# Patient Record
Sex: Female | Born: 1950 | Race: Black or African American | Hispanic: No | Smoking: Never smoker
Health system: Southern US, Community
[De-identification: ages and names within clinical notes are randomized; demographics above are authoritative.]

## PROBLEM LIST (undated history)

## (undated) DIAGNOSIS — I10 Essential (primary) hypertension: Secondary | ICD-10-CM

## (undated) DIAGNOSIS — M549 Dorsalgia, unspecified: Secondary | ICD-10-CM

## (undated) DIAGNOSIS — C07 Malignant neoplasm of parotid gland: Secondary | ICD-10-CM

## (undated) DIAGNOSIS — M1990 Unspecified osteoarthritis, unspecified site: Secondary | ICD-10-CM

## (undated) DIAGNOSIS — G8929 Other chronic pain: Secondary | ICD-10-CM

## (undated) HISTORY — PX: SALIVARY GLAND SURGERY: SHX768

---

## 2008-07-31 ENCOUNTER — Emergency Department: Payer: Self-pay | Admitting: Internal Medicine

## 2009-07-18 ENCOUNTER — Ambulatory Visit: Payer: Self-pay

## 2010-09-12 ENCOUNTER — Ambulatory Visit: Payer: Self-pay | Admitting: Family Medicine

## 2011-09-06 ENCOUNTER — Emergency Department: Payer: Self-pay | Admitting: *Deleted

## 2011-10-14 ENCOUNTER — Emergency Department: Payer: Self-pay | Admitting: Internal Medicine

## 2012-02-04 ENCOUNTER — Emergency Department: Payer: Self-pay | Admitting: Emergency Medicine

## 2012-04-04 ENCOUNTER — Emergency Department: Payer: Self-pay | Admitting: Emergency Medicine

## 2012-08-30 ENCOUNTER — Ambulatory Visit: Payer: Self-pay | Admitting: Family Medicine

## 2013-04-24 ENCOUNTER — Emergency Department: Payer: Self-pay | Admitting: Emergency Medicine

## 2013-08-04 ENCOUNTER — Ambulatory Visit: Payer: Self-pay | Admitting: Otolaryngology

## 2013-11-17 ENCOUNTER — Emergency Department: Payer: Self-pay | Admitting: Internal Medicine

## 2013-11-17 LAB — CBC
HCT: 38 % (ref 35.0–47.0)
HGB: 12.5 g/dL (ref 12.0–16.0)
MCH: 27.7 pg (ref 26.0–34.0)
MCHC: 32.8 g/dL (ref 32.0–36.0)
MCV: 84 fL (ref 80–100)
Platelet: 201 10*3/uL (ref 150–440)
RBC: 4.5 10*6/uL (ref 3.80–5.20)
RDW: 14.8 % — ABNORMAL HIGH (ref 11.5–14.5)
WBC: 7.8 10*3/uL (ref 3.6–11.0)

## 2013-11-17 LAB — BASIC METABOLIC PANEL
ANION GAP: 13 (ref 7–16)
BUN: 9 mg/dL (ref 7–18)
CO2: 24 mmol/L (ref 21–32)
CREATININE: 0.85 mg/dL (ref 0.60–1.30)
Calcium, Total: 9.2 mg/dL (ref 8.5–10.1)
Chloride: 98 mmol/L (ref 98–107)
EGFR (African American): >60
GLUCOSE: 140 mg/dL — AB (ref 65–99)
Osmolality: 271 (ref 275–301)
POTASSIUM: 2.9 mmol/L — AB (ref 3.5–5.1)
SODIUM: 135 mmol/L — AB (ref 136–145)

## 2013-11-17 LAB — D-DIMER(ARMC): D-DIMER: 725 ng/mL

## 2013-11-17 LAB — TROPONIN I: Troponin-I: <0.02 ng/mL

## 2014-01-08 ENCOUNTER — Emergency Department: Payer: Self-pay | Admitting: Emergency Medicine

## 2014-01-08 LAB — URINALYSIS, COMPLETE
Bacteria: NONE SEEN
Bilirubin,UR: NEGATIVE
Blood: NEGATIVE
Glucose,UR: NEGATIVE mg/dL (ref 0–75)
Hyaline Cast: 3
Ketone: NEGATIVE
Nitrite: NEGATIVE
Ph: 6 (ref 4.5–8.0)
Protein: NEGATIVE
RBC,UR: 1 /HPF (ref 0–5)
Specific Gravity: 1.014 (ref 1.003–1.030)
Squamous Epithelial: 6
WBC UR: 14 /HPF (ref 0–5)

## 2014-01-08 LAB — CBC
HCT: 33.2 % — AB (ref 35.0–47.0)
HGB: 11.3 g/dL — ABNORMAL LOW (ref 12.0–16.0)
MCH: 29.5 pg (ref 26.0–34.0)
MCHC: 34 g/dL (ref 32.0–36.0)
MCV: 87 fL (ref 80–100)
PLATELETS: 192 10*3/uL (ref 150–440)
RBC: 3.82 10*6/uL (ref 3.80–5.20)
RDW: 20.4 % — ABNORMAL HIGH (ref 11.5–14.5)
WBC: 6 10*3/uL (ref 3.6–11.0)

## 2014-01-08 LAB — COMPREHENSIVE METABOLIC PANEL
Albumin: 3.4 g/dL (ref 3.4–5.0)
Alkaline Phosphatase: 76 U/L
Anion Gap: 9 (ref 7–16)
BUN: 10 mg/dL (ref 7–18)
Bilirubin,Total: 0.5 mg/dL (ref 0.2–1.0)
Calcium, Total: 9.5 mg/dL (ref 8.5–10.1)
Chloride: 100 mmol/L (ref 98–107)
Co2: 26 mmol/L (ref 21–32)
Creatinine: 0.84 mg/dL (ref 0.60–1.30)
EGFR (African American): >60
EGFR (Non-African Amer.): >60
Glucose: 144 mg/dL — ABNORMAL HIGH (ref 65–99)
Osmolality: 272 (ref 275–301)
Potassium: 3.5 mmol/L (ref 3.5–5.1)
SGOT(AST): 10 U/L — ABNORMAL LOW (ref 15–37)
SGPT (ALT): 25 U/L
Sodium: 135 mmol/L — ABNORMAL LOW (ref 136–145)
Total Protein: 7.3 g/dL (ref 6.4–8.2)

## 2014-01-08 LAB — CK TOTAL AND CKMB (NOT AT ARMC): CK, TOTAL: 44 U/L

## 2014-01-08 LAB — TROPONIN I
Troponin-I: <0.02 ng/mL
Troponin-I: <0.02 ng/mL

## 2014-01-08 LAB — D-DIMER(ARMC): D-Dimer: 1106 ng/ml

## 2014-01-08 LAB — LIPASE, BLOOD: Lipase: 129 U/L (ref 73–393)

## 2014-03-25 ENCOUNTER — Ambulatory Visit: Payer: Self-pay | Admitting: Physical Medicine and Rehabilitation

## 2014-04-02 ENCOUNTER — Emergency Department: Payer: Self-pay | Admitting: Emergency Medicine

## 2014-04-02 LAB — COMPREHENSIVE METABOLIC PANEL WITH GFR
Albumin: 3.9 g/dL
Alkaline Phosphatase: 80 U/L
Anion Gap: 11
BUN: 13 mg/dL
Bilirubin,Total: 0.5 mg/dL
Calcium, Total: 9.7 mg/dL
Chloride: 103 mmol/L
Co2: 25 mmol/L
Creatinine: 1.06 mg/dL
EGFR (African American): >60
EGFR (Non-African Amer.): 56 — ABNORMAL LOW
Glucose: 100 mg/dL — ABNORMAL HIGH
Osmolality: 278
Potassium: 3.9 mmol/L
SGOT(AST): 19 U/L
SGPT (ALT): 32 U/L
Sodium: 139 mmol/L
Total Protein: 7.7 g/dL

## 2014-04-02 LAB — CBC
HCT: 37.2 %
HGB: 12.7 g/dL
MCH: 30.9 pg
MCHC: 34.1 g/dL
MCV: 91 fL
Platelet: 189 x10 3/mm 3
RBC: 4.1 X10 6/mm 3
RDW: 12.5 %
WBC: 9.5 x10 3/mm 3

## 2014-04-02 LAB — TROPONIN I: Troponin-I: <0.02 ng/mL

## 2014-04-07 ENCOUNTER — Emergency Department: Payer: Self-pay | Admitting: Emergency Medicine

## 2014-04-08 ENCOUNTER — Observation Stay: Payer: Self-pay | Admitting: Internal Medicine

## 2014-04-08 LAB — BASIC METABOLIC PANEL
Anion Gap: 9 (ref 7–16)
BUN: 13 mg/dL (ref 7–18)
Calcium, Total: 9.4 mg/dL (ref 8.5–10.1)
Chloride: 107 mmol/L (ref 98–107)
Co2: 27 mmol/L (ref 21–32)
Creatinine: 0.95 mg/dL (ref 0.60–1.30)
EGFR (African American): >60
EGFR (Non-African Amer.): >60
Glucose: 110 mg/dL — ABNORMAL HIGH (ref 65–99)
OSMOLALITY: 286 (ref 275–301)
POTASSIUM: 3.4 mmol/L — AB (ref 3.5–5.1)
Sodium: 143 mmol/L (ref 136–145)

## 2014-04-08 LAB — CBC WITH DIFFERENTIAL/PLATELET
Basophil #: 0 10*3/uL (ref 0.0–0.1)
Basophil %: 0.3 %
Eosinophil #: 0 10*3/uL (ref 0.0–0.7)
Eosinophil %: 0.2 %
HCT: 33.8 % — ABNORMAL LOW (ref 35.0–47.0)
HGB: 11.6 g/dL — ABNORMAL LOW (ref 12.0–16.0)
Lymphocyte #: 1 10*3/uL (ref 1.0–3.6)
Lymphocyte %: 13.1 %
MCH: 30.5 pg (ref 26.0–34.0)
MCHC: 34.3 g/dL (ref 32.0–36.0)
MCV: 89 fL (ref 80–100)
MONOS PCT: 11 %
Monocyte #: 0.9 x10 3/mm (ref 0.2–0.9)
NEUTROS ABS: 5.9 10*3/uL (ref 1.4–6.5)
NEUTROS PCT: 75.4 %
Platelet: 168 10*3/uL (ref 150–440)
RBC: 3.79 10*6/uL — ABNORMAL LOW (ref 3.80–5.20)
RDW: 13 % (ref 11.5–14.5)
WBC: 7.8 10*3/uL (ref 3.6–11.0)

## 2014-04-08 LAB — URINALYSIS, COMPLETE
BACTERIA: NONE SEEN
BILIRUBIN, UR: NEGATIVE
Blood: NEGATIVE
GLUCOSE, UR: NEGATIVE mg/dL (ref 0–75)
LEUKOCYTE ESTERASE: NEGATIVE
Nitrite: NEGATIVE
Ph: 5 (ref 4.5–8.0)
Protein: NEGATIVE
RBC,UR: <1 /HPF (ref 0–5)
Specific Gravity: 1.025 (ref 1.003–1.030)
Squamous Epithelial: NONE SEEN

## 2014-04-08 LAB — HEMOGLOBIN
HGB: 11.1 g/dL — ABNORMAL LOW (ref 12.0–16.0)
HGB: 10.5 g/dL — ABNORMAL LOW (ref 12.0–16.0)

## 2014-04-08 LAB — SEDIMENTATION RATE: Erythrocyte Sed Rate: 37 mm/hr — ABNORMAL HIGH (ref 0–30)

## 2014-04-08 LAB — TROPONIN I

## 2014-04-09 LAB — BASIC METABOLIC PANEL
Anion Gap: 6 — ABNORMAL LOW (ref 7–16)
BUN: 5 mg/dL — AB (ref 7–18)
CO2: 30 mmol/L (ref 21–32)
Calcium, Total: 8.3 mg/dL — ABNORMAL LOW (ref 8.5–10.1)
Chloride: 109 mmol/L — ABNORMAL HIGH (ref 98–107)
Creatinine: 0.74 mg/dL (ref 0.60–1.30)
GLUCOSE: 91 mg/dL (ref 65–99)
OSMOLALITY: 286 (ref 275–301)
POTASSIUM: 3.6 mmol/L (ref 3.5–5.1)
SODIUM: 145 mmol/L (ref 136–145)

## 2014-04-09 LAB — CBC WITH DIFFERENTIAL/PLATELET
BASOS ABS: 0 10*3/uL (ref 0.0–0.1)
Basophil %: 0.3 %
Eosinophil #: 0 10*3/uL (ref 0.0–0.7)
Eosinophil %: 0.4 %
HCT: 30.2 % — ABNORMAL LOW (ref 35.0–47.0)
HGB: 10.4 g/dL — ABNORMAL LOW (ref 12.0–16.0)
LYMPHS PCT: 17 %
Lymphocyte #: 0.9 10*3/uL — ABNORMAL LOW (ref 1.0–3.6)
MCH: 30.9 pg (ref 26.0–34.0)
MCHC: 34.5 g/dL (ref 32.0–36.0)
MCV: 90 fL (ref 80–100)
MONO ABS: 0.8 x10 3/mm (ref 0.2–0.9)
Monocyte %: 14.6 %
NEUTROS ABS: 3.5 10*3/uL (ref 1.4–6.5)
Neutrophil %: 67.7 %
Platelet: 154 10*3/uL (ref 150–440)
RBC: 3.36 10*6/uL — ABNORMAL LOW (ref 3.80–5.20)
RDW: 12.9 % (ref 11.5–14.5)
WBC: 5.2 10*3/uL (ref 3.6–11.0)

## 2014-04-10 LAB — BASIC METABOLIC PANEL
Anion Gap: 8 (ref 7–16)
BUN: 6 mg/dL — ABNORMAL LOW (ref 7–18)
CALCIUM: 8.6 mg/dL (ref 8.5–10.1)
CO2: 28 mmol/L (ref 21–32)
CREATININE: 0.76 mg/dL (ref 0.60–1.30)
Chloride: 109 mmol/L — ABNORMAL HIGH (ref 98–107)
EGFR (Non-African Amer.): >60
Glucose: 116 mg/dL — ABNORMAL HIGH (ref 65–99)
OSMOLALITY: 287 (ref 275–301)
Potassium: 3.5 mmol/L (ref 3.5–5.1)
Sodium: 145 mmol/L (ref 136–145)

## 2014-04-10 LAB — CBC WITH DIFFERENTIAL/PLATELET
Basophil #: 0 10*3/uL (ref 0.0–0.1)
Basophil %: 0.2 %
EOS PCT: 0 %
Eosinophil #: 0 10*3/uL (ref 0.0–0.7)
HCT: 30.1 % — AB (ref 35.0–47.0)
HGB: 10.6 g/dL — ABNORMAL LOW (ref 12.0–16.0)
Lymphocyte #: 1 10*3/uL (ref 1.0–3.6)
Lymphocyte %: 15 %
MCH: 31.3 pg (ref 26.0–34.0)
MCHC: 35.1 g/dL (ref 32.0–36.0)
MCV: 89 fL (ref 80–100)
MONO ABS: 0.7 x10 3/mm (ref 0.2–0.9)
Monocyte %: 11.4 %
Neutrophil #: 4.7 10*3/uL (ref 1.4–6.5)
Neutrophil %: 73.4 %
Platelet: 160 10*3/uL (ref 150–440)
RBC: 3.38 10*6/uL — AB (ref 3.80–5.20)
RDW: 12.6 % (ref 11.5–14.5)
WBC: 6.4 10*3/uL (ref 3.6–11.0)

## 2014-04-27 ENCOUNTER — Emergency Department: Payer: Self-pay | Admitting: Emergency Medicine

## 2014-04-28 LAB — URINALYSIS, COMPLETE
BLOOD: NEGATIVE
Bacteria: NONE SEEN
Bilirubin,UR: NEGATIVE
Glucose,UR: NEGATIVE mg/dL (ref 0–75)
Ketone: NEGATIVE
Nitrite: NEGATIVE
PROTEIN: NEGATIVE
Ph: 5 (ref 4.5–8.0)
RBC,UR: <1 /HPF (ref 0–5)
Specific Gravity: 1.019 (ref 1.003–1.030)
Squamous Epithelial: 2

## 2014-08-07 ENCOUNTER — Emergency Department: Payer: Self-pay | Admitting: Emergency Medicine

## 2014-08-13 ENCOUNTER — Emergency Department: Payer: Self-pay | Admitting: Emergency Medicine

## 2014-08-22 ENCOUNTER — Emergency Department: Payer: Self-pay | Admitting: Emergency Medicine

## 2014-09-16 NOTE — H&P (Signed)
PATIENT NAME:  Theresa Mercado, Theresa Mercado MR#:  [id number] DATE OF BIRTH:  08/08/1950  DATE OF ADMISSION:  04/08/2014  ADMITTING PHYSICIAN: Gladstone Lighter, MD  PRIMARY CARE PHYSICIAN: [facility]   CHIEF COMPLAINT: Low back pain.   HISTORY OF PRESENT ILLNESS: Ms. Forsman is a 64 year old obese African American female with a past medical history significant for hypertension, history of parotid gland cancer, status post surgery and chemoradiation, presents to the hospital secondary to acute on chronic low back. The patient has been having a low back pain for several years now, but got worse recently, for which she was referred to see [facility] physiotherapist Dr. Sharlet Salina. She had an outpatient MRI done on 03/25/2014, which showed degenerative disease for age, moderate left facet arthrosis, no stenosis. The patient just had 2 doses of steroids done at a couple of lower back spaces 2 days ago on 04/06/2014 by Dr. Sharlet Salina. In spite of injection, she went home. She was having worsening of her pain, especially on the right side, paraspinal area, radiating all the way to her right hip. No sensory changes beyond the knees in both legs. Her ability to stand and walk has been impaired secondary to worsening pain, and she presented to the ER yesterday morning. She was discharged on ibuprofen, Percocet, pain medication, and also  diazepam as needed for muscle spasms. However, pain got worse. She came back to the ER. All night long she received IV Dilaudid for pain, and is being admitted for pain management.   PAST MEDICAL HISTORY:  1.  Hypertension.  2.  Parotid cancer in remission.   PAST SURGICAL HISTORY: Left facial reconstruction after parotid gland tumor resection.   ALLERGIES TO MEDICATIONS: No known drug allergies.   CURRENT HOME MEDICATIONS:  1.  Lisinopril 5 mg p.o. daily.  2.  Tramadol 50 mg p.o. q. 6 hours p.r.n. for pain.   THE PATIENT WAS DISCHARGED ON THE FOLLOWING MEDICATIONS FROM THE  EMERGENCY ROOM:  1.  Motrin 800 mg 1 capsule q. 8 hours p.r.n. for pain.  2.  Diazepam 2 mg p.o. 3 times a day p.r.n.  3.  Percocet 10/325 mg 1 tablet q. 6 hours p.r.n. for pain.   SOCIAL HISTORY: Lives at home with her daughter. Usually ambulates without any assistance. No smoking or alcohol use.   FAMILY HISTORY: Significant for multiple cancers in the family. Sister with breast cancer, mother with gastric cancer, and brothers with prostate and colon cancer.   REVIEW OF SYSTEMS:  CONSTITUTIONAL: No fever, fatigue, or weakness.  EYES: No blurred vision, double vision, inflammation, or glaucoma.  ENT: No tinnitus, ear pain, hearing loss, epistaxis, or discharge.  RESPIRATORY: No cough, wheeze, hemoptysis, or COPD.  CARDIOVASCULAR: No chest pain, orthopnea, edema, arrhythmia, palpitations, or syncope.  GASTROINTESTINAL: Positive for nausea, vomiting, hematemesis here in the Emergency Room. No abdominal pain. No rectal bleed.  GENITOURINARY: No dysuria, hematuria, renal calculus, frequency, or incontinence.  ENDOCRINE: No polyuria, nocturia, thyroid problems, heat or cold intolerance.  HEMATOLOGY: No anemia, easy bruising or bleeding.  SKIN: No acne, rash or lesions.  MUSCULOSKELETAL: Positive for low back pain, especially on the right side, and positive for arthritis. No gout.  NEUROLOGICAL: No numbness, weakness, CVA, TIA, or seizures.  PSYCHOLOGICAL: No anxiety, insomnia, or depression.   PHYSICAL EXAMINATION:  VITAL SIGNS: Temperature 98.1 degrees Fahrenheit, pulse 115, respirations 22, blood pressure 168/84, pulse oximetry 98% on room air.  GENERAL: Heavily built, well nourished female lying in bed, not in  any acute distress.  HEENT: Normocephalic, atraumatic. Asymmetric face from recent surgery. Otherwise, pupils are equal, round, reacting to light. Anicteric sclerae. Extraocular movements intact. Oropharynx is clear, without erythema, mass or exudates.  NECK: Supple. No thyromegaly,  JVD, or carotid bruits. No lymphadenopathy.  LUNGS: Moving air bilaterally. No wheeze or crackles. No use of accessory muscles for breathing.  CARDIOVASCULAR: S1, S2. Regular rate and rhythm.   ABDOMEN: Soft, nontender, nondistended. No hepatosplenomegaly. Normal bowel sounds.  EXTREMITIES: No pedal edema. No clubbing or cyanosis. 2+ dorsalis pedis pulses palpable bilaterally.  SKIN: No acne, rash or lesions.  MUSCULOSKELETAL: No neck, back, shoulder pain, arthritis or gout.  NEUROLOGICAL: Cranial nerves intact. No focal motor or sensory deficits.  PSYCHOLOGICAL: The patient is awake, alert, and oriented x 3.   LABORATORY DATA: WBC 7.8, hemoglobin 11.6, hematocrit 33.8, platelet count 168,000. Sodium 143, potassium 3.4, chloride 107, bicarbonate 27, BUN 13, creatinine 0.95, glucose 110 and calcium of 9.4. ESR is slightly elevated at 37. Troponins negative. The urinalysis is pending at this time.   ASSESSMENT AND PLAN: This is a 64 year old female with a history of chronic back pain, recently got steroid injections, hypertension,  admitted for acute on chronic low back pain requiring 2 emergency room visits within 24 hours, and also IV pain medications.  1.  Acute on chronic low back pain, especially on the right side. Negative straight leg raising test. MRA from 03/25/2014 has been reviewed; has facet arthrosis, especially on the left; but her pain is on the right side now. It could be muscular pain. We will order a lumbar x-ray, start her on Flexeril, morphine, Percocet, and physical therapy at this time. We will admit her. Also check a urinalysis to rule out any urinary tract infection and pyelonephritis.  2.  Hematemesis, 1 time episode in the Emergency Room. She was discharged yesterday with Motrin 856m from the ER and she took it, so could be related to that. Will give IV fluids, hemoglobin check every 8 hours, Protonix b.i.d. for now, and avoid nonsteroidal anti-inflammatory drugs  and aspirin.   2.  Hypertension on lisinopril.  3.  Deep vein thrombosis prophylaxis with her hematemesis. Avoid anticoagulants. We will just do a TEDs and SCDs.   CODE STATUS: Full Code.   TIME SPENT ON ADMISSION: 50 minutes.    ____________________________ RGladstone Lighter MD rk:MT D: 04/08/2014 08:09:00 ET T: 04/08/2014 08:46:47 ET JOB#: [id number] cc: RGladstone Lighter MD, <Dictator> BLoletha Grayer Chasnis, DO [facility]  RGladstone LighterMD ELECTRONICALLY SIGNED 04/15/2014 15:06

## 2014-09-16 NOTE — Discharge Summary (Signed)
PATIENT NAME:  Theresa Mercado, Theresa Mercado MR#:  [id number] DATE OF BIRTH:  1950-09-12  DATE OF ADMISSION:  04/08/2014 DATE OF DISCHARGE:  04/10/2014  PRESENTING COMPLAINT: Back pain, more on the right than the left.   DISCHARGE DIAGNOSES: 1.  Acute on chronic low back pain, right more than left, with mild radicular symptoms up to right upper thigh, improving slowly.  2.  Hypertension.   CODE STATUS: FULL code.   MEDICATIONS: 1.  Lisinopril 10 mg daily.  2.  Acetaminophen/oxycodone 325/10 one every 6 hours as needed.  3.  Diazepam 2 mg t.i.d. as needed.  4.  Medrol Dosepak take as directed.  5.  Lidocaine 5% topical patch 1 daily.   NOTE: The patient advised not to take NSAIDs.   DISCHARGE DIET: Low sodium diet.   DISCHARGE FOLLOWUP:  1.  Dr. Sharlet Salina at [facility].  2.  Outpatient physical therapy.   DIAGNOSTIC DATA: H and H 10.6 and 30.5. UA negative for UTI. Hemoglobin on admission was 11.6.   CONSULTATIONS: Physical therapy.   BRIEF SUMMARY OF HOSPITAL COURSE: Theresa Mercado is a 64 year old obese African American female with history of hypertension who has history of chronic back pain, recently got a steroid injection, seen by physiotherapist. MRI as outpatient with facet joint arthrosis. She came into the Emergency Room with acute on chronic low back pain requiring 2 ER visits within 24 hours and also IV pain meds. She was admitted with:  1.  Acute on chronic low back pain, especially on the right. MRI from 03/25/2014 reviewed. It did show facet joint arthrosis. The patient denied lumbar x-rays. She was started on Medrol Dosepak, Lidoderm patch, narcotics and muscle relaxants. She did have mild radicular symptoms, more on the right side. Physical therapy recommended outpatient PT. Since the patient is improving slowly with PT, and with above treatment, further MRI imaging was kept on hold. She is advised to follow up with Dr. Sharlet Salina in 7 to 10 days and further imaging studies per Dr.  Sharlet Salina as outpatient.  2.  Hematemesis, once, mild, in the ER. Discharged on Motrin 800 mg. Likely secondary to NSAID-induced mild gastritis. Resolved. Tolerating diet well.  3.  Hypertension. Continued lisinopril. 4.  DVT prophylaxis. Avoided anticoagulation secondary to an episode of hematemesis. SCDs and TEDs were used. The patient was ambulatory in the hospital.   Hospital stay otherwise remained stable.   TIME SPENT: 40 minutes.   ____________________________ Theresa Rochester Posey Pronto, MD sap:sb D: 04/11/2014 07:04:46 ET T: 04/11/2014 07:38:30 ET JOB#: [id number]  cc: Theresa Mccaskill A. Posey Pronto, MD, <Dictator> Theresa Basset MD ELECTRONICALLY SIGNED 04/27/2014 14:05

## 2014-09-27 ENCOUNTER — Emergency Department: Payer: No Typology Code available for payment source

## 2014-09-27 ENCOUNTER — Encounter: Payer: Self-pay | Admitting: Emergency Medicine

## 2014-09-27 ENCOUNTER — Emergency Department
Admission: EM | Admit: 2014-09-27 | Discharge: 2014-09-27 | Disposition: A | Discharge status: Home / Self Care | Payer: No Typology Code available for payment source | Attending: Internal Medicine | Admitting: Internal Medicine

## 2014-09-27 DIAGNOSIS — R319 Hematuria, unspecified: Secondary | ICD-10-CM

## 2014-09-27 DIAGNOSIS — I10 Essential (primary) hypertension: Secondary | ICD-10-CM | POA: Insufficient documentation

## 2014-09-27 DIAGNOSIS — R000 Tachycardia, unspecified: Secondary | ICD-10-CM | POA: Diagnosis not present

## 2014-09-27 DIAGNOSIS — G8929 Other chronic pain: Secondary | ICD-10-CM | POA: Insufficient documentation

## 2014-09-27 DIAGNOSIS — R109 Unspecified abdominal pain: Secondary | ICD-10-CM | POA: Insufficient documentation

## 2014-09-27 DIAGNOSIS — M549 Dorsalgia, unspecified: Secondary | ICD-10-CM | POA: Insufficient documentation

## 2014-09-27 DIAGNOSIS — R52 Pain, unspecified: Secondary | ICD-10-CM

## 2014-09-27 HISTORY — DX: Other chronic pain: G89.29

## 2014-09-27 HISTORY — DX: Essential (primary) hypertension: I10

## 2014-09-27 HISTORY — DX: Unspecified osteoarthritis, unspecified site: M19.90

## 2014-09-27 HISTORY — DX: Dorsalgia, unspecified: M54.9

## 2014-09-27 LAB — CBC WITH DIFFERENTIAL/PLATELET
Basophils Absolute: 0 10*3/uL (ref 0–0.1)
Basophils Relative: 0 %
Eosinophils Absolute: 0 10*3/uL (ref 0–0.7)
Eosinophils Relative: 0 %
HCT: 37.4 % (ref 35.0–47.0)
HEMOGLOBIN: 12.5 g/dL (ref 12.0–16.0)
LYMPHS ABS: 1.4 10*3/uL (ref 1.0–3.6)
LYMPHS PCT: 20 %
MCH: 29.4 pg (ref 26.0–34.0)
MCHC: 33.6 g/dL (ref 32.0–36.0)
MCV: 87.8 fL (ref 80.0–100.0)
MONOS PCT: 10 %
Monocytes Absolute: 0.7 10*3/uL (ref 0.2–0.9)
NEUTROS PCT: 70 %
Neutro Abs: 4.7 10*3/uL (ref 1.4–6.5)
PLATELETS: 179 10*3/uL (ref 150–440)
RBC: 4.26 MIL/uL (ref 3.80–5.20)
RDW: 13.1 % (ref 11.5–14.5)
WBC: 6.8 10*3/uL (ref 3.6–11.0)

## 2014-09-27 LAB — URINALYSIS COMPLETE WITH MICROSCOPIC (ARMC ONLY)
BACTERIA UA: NONE SEEN
BILIRUBIN URINE: NEGATIVE
GLUCOSE, UA: NEGATIVE mg/dL
KETONES UR: NEGATIVE mg/dL
Nitrite: NEGATIVE
Protein, ur: NEGATIVE mg/dL
SPECIFIC GRAVITY, URINE: 1.003 — AB (ref 1.005–1.030)
pH: 7 (ref 5.0–8.0)

## 2014-09-27 LAB — COMPREHENSIVE METABOLIC PANEL
ALBUMIN: 4.2 g/dL (ref 3.5–5.0)
ALT: 17 U/L (ref 14–54)
AST: 14 U/L — ABNORMAL LOW (ref 15–41)
Alkaline Phosphatase: 53 U/L (ref 38–126)
Anion gap: 8 (ref 5–15)
BILIRUBIN TOTAL: 0.3 mg/dL (ref 0.3–1.2)
BUN: 16 mg/dL (ref 6–20)
CO2: 25 mmol/L (ref 22–32)
CREATININE: 0.79 mg/dL (ref 0.44–1.00)
Calcium: 9.8 mg/dL (ref 8.9–10.3)
Chloride: 107 mmol/L (ref 101–111)
GFR calc non Af Amer: >60 mL/min (ref >60)
GLUCOSE: 102 mg/dL — AB (ref 65–99)
Potassium: 3.6 mmol/L (ref 3.5–5.1)
Sodium: 140 mmol/L (ref 135–145)
Total Protein: 7.1 g/dL (ref 6.5–8.1)

## 2014-09-27 MED ORDER — CIPROFLOXACIN HCL 500 MG PO TABS: 500.0000 mg | ORAL_TABLET | Freq: Two times a day (BID) | ORAL | Status: AC

## 2014-09-27 MED ORDER — SODIUM CHLORIDE 0.9 % IV SOLN
INTRAVENOUS | Status: DC
  Administered 2014-09-27: 13:00:00 via INTRAVENOUS

## 2014-09-27 MED ORDER — KETOROLAC TROMETHAMINE 10 MG PO TABS: 10.0000 mg | ORAL_TABLET | Freq: Three times a day (TID) | ORAL | Status: AC | PRN

## 2014-09-27 MED ORDER — ONDANSETRON 8 MG PO TBDP
8.0000 mg | ORAL_TABLET | Freq: Three times a day (TID) | ORAL | Status: AC | PRN
Start: 2014-09-27 — End: 2014-09-30

## 2014-09-27 MED ORDER — HYDROCODONE-ACETAMINOPHEN 5-325 MG PO TABS: 1.0000 | ORAL_TABLET | Freq: Four times a day (QID) | ORAL | Status: AC | PRN

## 2014-09-27 NOTE — ED Notes (Signed)
MD at bedside. 

## 2014-09-27 NOTE — ED Notes (Signed)
Pt reports right flank pain.  Pt has left side facial droop, states it is not new.

## 2014-09-27 NOTE — ED Notes (Signed)
Pt has chronic low back pain radiates down into right hip.  Pt advises worse last 4 days.

## 2014-09-27 NOTE — ED Provider Notes (Signed)
[facility] Emergency Department Provider Note    ____________________________________________  Time seen: 12:08  I have reviewed the triage vital signs and the nursing notes. Patient is being interviewed and examined in the presence of a family member.  HISTORY  Chief Complaint Flank Pain and Back Pain       HPI Theresa Mercado is a 64 y.o. female who presents to the emergency department with a four-day history of right flank right back pain which radiates into the right hip. She describes the pain as being predominantly in the right upper back flank area that started 4 days ago it's been constant since the onset it has ranged in severity from moderate to severe from 5-8/10. She describes it as throbbing and aching. She has had no recent injuries and no falls no recent surgery no recent meds. She does not think it feels worse with motion. She has not had any difficulty walking or ambulating. Associated symptoms she denies any dysuria no fever no nausea or vomiting. Negative for abdominal pain or dysuria No diarrhea she has not noticed blood in her urine. She denies any history of renal colic or kidney stones.  She did have a similar pain 5 years ago after a fall she did have a full workup at that time which did not reveal any etiology for the pain other than musculoskeletal pain it feels similar to that that is different in that last time she did have increased pain with motion and this time she does not.  She has tried tramadol at home in the past 2 days but has no relief of the pain.       Past Medical History  Diagnosis Date  . Hypertension   . Back pain, chronic   . Arthritis   .skin cancer on the left side of the face.  . 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  There are no active problems to display for this patient.   No past surgical history on file.  No current outpatient prescriptions on  file.  Allergies Review of patient's allergies indicates no known allergies.  No family history on file.  Social History History  Substance Use Topics  . Smoking status: Never Smoker   . Smokeless tobacco: Not on file  . Alcohol Use: No    Review of Systems  Constitutional: Negative for fever. Eyes: Negative for visual changes. ENT: Negative for sore throat. Cardiovascular: Negative for chest pain. Respiratory: Negative for shortness of breath. Gastrointestinal: Negative for abdominal pain, vomiting and diarrhea. Genitourinary: Negative for dysuria. Positive for right flank pain Musculoskeletal: Positive for back pain on the right side. Skin: Negative for rash. Neurological: Negative for headaches, focal weakness or numbness.   10-point ROS otherwise negative.  ____________________________________________   PHYSICAL EXAM:  VITAL SIGNS: ED Triage Vitals  Enc Vitals Group     BP 09/27/14 1135 127/64 mmHg     Pulse Rate 09/27/14 1135 124     Resp --      Temp 09/27/14 1135 97.3 F (36.3 C)     Temp Source 09/27/14 1135 Oral     SpO2 09/27/14 1135 96 %     Weight 09/27/14 1135 225 lb (102.059 kg)     Height 09/27/14 1135 5\' 8"  (1.727 m)     Head Cir --      Peak Flow --      Pain Score 09/27/14 1140 9     Pain Loc --  Pain Edu? --      Excl. in [place]? --    Initial vital signs in the triage area were reviewed her blood pressure is stable at 127/64 she is tachycardic at a heart rate of 124 O2 sat is 96% on room air and she is afebrile.  Constitutional: Alert and oriented. Well appearing and in mild to moderate pain Eyes: Conjunctivae are normal. PERRL. Normal extraocular movements. ENT   Head: Normocephalic and atraumatic.   Nose: No congestion/rhinnorhea.   Mouth/Throat: Mucous membranes are moist.   Neck: No stridor. Hematological/Lymphatic/Immunilogical: No cervical lymphadenopathy. Cardiovascular: Normal rate, regular rhythm. Normal and  symmetric distal pulses are present in all extremities. No murmurs, rubs, or gallops. Respiratory: Normal respiratory effort without tachypnea nor retractions. Breath sounds are clear and equal bilaterally. No wheezes/rales/rhonchi. Gastrointestinal: Soft and nontender. No distention. No abdominal bruits.  CVA: tenderness on the right Genitourinary: Deferred Musculoskeletal: Nontender with normal range of motion in all extremities. No joint effusions.  No lower extremity tenderness nor edema. Slight tenderness on deep palpation of the right paraspinal muscles. Neurologic:  Normal speech and language. No gross focal neurologic deficits are appreciated. Speech is normal. No gait instability. Skin:  Skin is warm, dry and intact. No rash noted. Psychiatric: Mood and affect are normal. Speech and behavior are normal. Patient exhibits appropriate insight and judgment.  ____________________________________________    LABS (pertinent positives/negatives) Labs Reviewed  COMPREHENSIVE METABOLIC PANEL - Abnormal; Notable for the following:    Glucose, Bld 102 (*)    AST 14 (*)    All other components within normal limits  URINALYSIS COMPLETEWITH MICROSCOPIC ([facility])  - Abnormal; Notable for the following:    Color, Urine YELLOW (*)    APPearance HAZY (*)    Specific Gravity, Urine 1.003 (*)    Hgb urine dipstick 2+ (*)    Leukocytes, UA 3+ (*)    Squamous Epithelial / LPF 6-30 (*)    All other components within normal limits  CBC WITH DIFFERENTIAL/PLATELET    abnormal laboratory evaluation is reviewed and year-old slightly elevated glucose at 102 slightly diminished AST of 14 and these are nonsignificant. Also the urine does show positive for hemoglobin on the dipstick and 3+ leukocytes.      ____________________________________________ _____  No EKG obtained in the ER  _______________________________________    RADIOLOGY  a KUB reveals no  abnormalities  _________________________________________   PROCEDURES  Procedure(s) performed: None  Critical Care performed: No  ____________________________________________   INITIAL IMPRESSION / ASSESSMENT AND PLAN / ED COURSE  Pertinent labs & imaging results that were available during my care of the patient were reviewed by me and considered in my medical decision making (see chart for details).  Patient presents to the emergency department with a four-day history of right flank, right back pain that radiates towards the right hip. She has a previous history of musculoskeletal pain this may be musculoskeletal in nature. Included in the differential diagnosis is renal colic or pyelonephritis.  In the emergency department IV is started and IV hydration with norma  saline is initiated.  Her heart rate is elevated this may represent pain or hydration issue. After the initiation of the IV and hydration IV antiemetics, Zofran, and IV analgesics, IV morphine, was given in the emergency department  A KUB and labs are pending at this time.  On review of the labs and KUB the only abnormality that is significant is hematuria and leukocytes in the urine. His improved with analgesic  and hydration in the ER the plan is to dismiss her with by mouth analgesics by mouth antibiotics and slipped ciprofloxacin. Also to encourage by mouth hydration.  ----------------------------------------- 3:49 PM on 09/27/2014 -----------------------------------------        ----------------------------------------- 12:47 PM on 09/27/2014 -----------------------------------------   ____________________________________________   FINAL CLINICAL IMPRESSION(S) / ED DIAGNOSES  Final diagnoses:  None  1. Right flank pain 2. Hematuria    Boris Lown, DO 09/27/14 1550

## 2014-09-27 NOTE — Discharge Instructions (Signed)
Take meds as directed. Increase fluids. See the dr at the clinic in follow up as directed. If your symptoms worsen return to the er.

## 2014-10-01 ENCOUNTER — Emergency Department
Admission: EM | Admit: 2014-10-01 | Discharge: 2014-10-01 | Disposition: A | Discharge status: Home / Self Care | Payer: No Typology Code available for payment source | Attending: Emergency Medicine | Admitting: Emergency Medicine

## 2014-10-01 DIAGNOSIS — N390 Urinary tract infection, site not specified: Secondary | ICD-10-CM | POA: Diagnosis not present

## 2014-10-01 DIAGNOSIS — Z79899 Other long term (current) drug therapy: Secondary | ICD-10-CM | POA: Diagnosis not present

## 2014-10-01 DIAGNOSIS — R319 Hematuria, unspecified: Secondary | ICD-10-CM

## 2014-10-01 DIAGNOSIS — R069 Unspecified abnormalities of breathing: Secondary | ICD-10-CM | POA: Diagnosis present

## 2014-10-01 DIAGNOSIS — I10 Essential (primary) hypertension: Secondary | ICD-10-CM | POA: Insufficient documentation

## 2014-10-01 LAB — URINALYSIS COMPLETE WITH MICROSCOPIC (ARMC ONLY)
BILIRUBIN URINE: NEGATIVE
Glucose, UA: NEGATIVE mg/dL
KETONES UR: NEGATIVE mg/dL
Nitrite: NEGATIVE
PH: 5 (ref 5.0–8.0)
Protein, ur: 30 mg/dL — AB
Specific Gravity, Urine: 1.03 (ref 1.005–1.030)
TRANS EPITHEL UA: 1

## 2014-10-01 MED ORDER — SULFAMETHOXAZOLE-TRIMETHOPRIM 800-160 MG PO TABS: 1.0000 | ORAL_TABLET | Freq: Two times a day (BID) | ORAL | Status: DC

## 2014-10-01 MED ORDER — TIZANIDINE HCL 4 MG PO TABS: 4.0000 mg | ORAL_TABLET | Freq: Three times a day (TID) | ORAL | Status: AC | PRN

## 2014-10-01 MED ORDER — ORPHENADRINE CITRATE 30 MG/ML IJ SOLN
60.0000 mg | Freq: Once | INTRAMUSCULAR | Status: AC
  Administered 2014-10-01: 60 mg via INTRAMUSCULAR

## 2014-10-01 NOTE — ED Notes (Signed)
Pt presents to ER alert and in NAD. Pt reports she fell "a long time ago." and has residual pain. pt states worse pain tonight, denies new injury. Denies urinary s/s.

## 2014-10-01 NOTE — ED Notes (Signed)
Pt has left side facial droop at baseline.

## 2014-10-01 NOTE — ED Notes (Signed)
Patient c/o acute on chronic spasmotic back pain. States that she is seen by Dr. Barbarann Ehlers and was directed to come to the ED for treatment of acute flares of her chronic back pain. Full baseline AROM, Distal neuro intact x4

## 2014-10-03 ENCOUNTER — Emergency Department
Admission: EM | Admit: 2014-10-03 | Discharge: 2014-10-03 | Disposition: A | Discharge status: Home / Self Care | Payer: No Typology Code available for payment source | Attending: Emergency Medicine | Admitting: Emergency Medicine

## 2014-10-03 DIAGNOSIS — I10 Essential (primary) hypertension: Secondary | ICD-10-CM | POA: Diagnosis not present

## 2014-10-03 DIAGNOSIS — M5441 Lumbago with sciatica, right side: Secondary | ICD-10-CM | POA: Insufficient documentation

## 2014-10-03 DIAGNOSIS — Z791 Long term (current) use of non-steroidal anti-inflammatories (NSAID): Secondary | ICD-10-CM | POA: Diagnosis not present

## 2014-10-03 DIAGNOSIS — Z792 Long term (current) use of antibiotics: Secondary | ICD-10-CM | POA: Diagnosis not present

## 2014-10-03 DIAGNOSIS — M545 Low back pain: Secondary | ICD-10-CM | POA: Diagnosis present

## 2014-10-03 DIAGNOSIS — M5440 Lumbago with sciatica, unspecified side: Secondary | ICD-10-CM

## 2014-10-03 DIAGNOSIS — Z79899 Other long term (current) drug therapy: Secondary | ICD-10-CM | POA: Diagnosis not present

## 2014-10-03 LAB — URINALYSIS COMPLETE WITH MICROSCOPIC (ARMC ONLY)
Bacteria, UA: NONE SEEN
Bilirubin Urine: NEGATIVE
Glucose, UA: NEGATIVE mg/dL
Hgb urine dipstick: NEGATIVE
KETONES UR: NEGATIVE mg/dL
Nitrite: NEGATIVE
PH: 6 (ref 5.0–8.0)
Protein, ur: NEGATIVE mg/dL
Specific Gravity, Urine: 1.01 (ref 1.005–1.030)

## 2014-10-03 MED ORDER — PROMETHAZINE HCL 25 MG/ML IJ SOLN
INTRAMUSCULAR | Status: AC
Start: 2014-10-03 — End: 2014-10-03
  Filled 2014-10-03: qty 1

## 2014-10-03 MED ORDER — PROMETHAZINE HCL 25 MG/ML IJ SOLN
25.0000 mg | Freq: Once | INTRAMUSCULAR | Status: AC
  Administered 2014-10-03: 08:00:00 via INTRAMUSCULAR

## 2014-10-03 MED ORDER — ETODOLAC 500 MG PO TABS: 500.0000 mg | ORAL_TABLET | Freq: Two times a day (BID) | ORAL | Status: DC

## 2014-10-03 MED ORDER — HYDROCODONE-ACETAMINOPHEN 5-325 MG PO TABS: 1.0000 | ORAL_TABLET | Freq: Four times a day (QID) | ORAL | Status: DC | PRN

## 2014-10-03 MED ORDER — MEPERIDINE HCL 25 MG/ML IJ SOLN
25.0000 mg | Freq: Once | INTRAMUSCULAR | Status: AC
  Administered 2014-10-03: 08:00:00 via INTRAMUSCULAR
  Administered 2014-10-03: 25 mg via INTRAMUSCULAR

## 2014-10-03 MED ORDER — MEPERIDINE HCL 25 MG/ML IJ SOLN
INTRAMUSCULAR | Status: AC
  Filled 2014-10-03: qty 1

## 2014-10-03 MED ORDER — DIAZEPAM 5 MG PO TABS: 5.0000 mg | ORAL_TABLET | Freq: Four times a day (QID) | ORAL | Status: DC | PRN

## 2014-10-03 NOTE — ED Notes (Signed)
Patient informed needed urine specimen. Stated she had just voided.

## 2014-10-03 NOTE — Discharge Instructions (Signed)
Back Pain, Adult °Back pain is very common. The pain often gets better over time. The cause of back pain is usually not dangerous. Most people can learn to manage their back pain on their own.  °HOME CARE  °· Stay active. Start with short walks on flat ground if you can. Try to walk farther each day. °· Do not sit, drive, or stand in one place for more than 30 minutes. Do not stay in bed. °· Do not avoid exercise or work. Activity can help your back heal faster. °· Be careful when you bend or lift an object. Bend at your knees, keep the object close to you, and do not twist. °· Sleep on a firm mattress. Lie on your side, and bend your knees. If you lie on your back, put a pillow under your knees. °· Only take medicines as told by your doctor. °· Put ice on the injured area. °¨ Put ice in a plastic bag. °¨ Place a towel between your skin and the bag. °¨ Leave the ice on for 15-20 minutes, 03-04 times a day for the first 2 to 3 days. After that, you can switch between ice and heat packs. °· Ask your doctor about back exercises or massage. °· Avoid feeling anxious or stressed. Find good ways to deal with stress, such as exercise. °GET HELP RIGHT AWAY IF:  °· Your pain does not go away with rest or medicine. °· Your pain does not go away in 1 week. °· You have new problems. °· You do not feel well. °· The pain spreads into your legs. °· You cannot control when you poop (bowel movement) or pee (urinate). °· Your arms or legs feel weak or lose feeling (numbness). °· You feel sick to your stomach (nauseous) or throw up (vomit). °· You have belly (abdominal) pain. °· You feel like you may pass out (faint). °MAKE SURE YOU:  °· Understand these instructions. °· Will watch your condition. °· Will get help right away if you are not doing well or get worse. °Document Released: 10/29/2007 Document Revised: 08/04/2011 Document Reviewed: 09/13/2013 °[employer]® Patient Information ©2015 [employer]. This information is not intended  to replace advice given to you by your health care provider. Make sure you discuss any questions you have with your health care provider. ° °

## 2014-10-03 NOTE — ED Notes (Signed)
Patient resting in bed. Family at bedside.

## 2014-10-03 NOTE — ED Notes (Signed)
Provider notified

## 2014-10-03 NOTE — ED Notes (Signed)
Developed mid to lower back pain  Since Monday  Was seen on  Sunday with same. Was given toradol and zanaflex w/o min results. Describes pain as spasm like pain

## 2014-10-03 NOTE — ED Notes (Signed)
Pt to triage via w/c; st having lower back pain and right hip pain; st seen here Sunday and dx with muscle spasms; st hx of same

## 2014-10-03 NOTE — ED Provider Notes (Signed)
[facility] Emergency Department Provider Note  ____________________________________________  Time seen: ----------------------------------------- 7:46 AM on 10/03/2014 -----------------------------------------    I have reviewed the triage vital signs and the nursing notes.   HISTORY  Chief Complaint Back Pain    HPI Theresa Mercado is a 64 y.o. female who presents to the emergency department with a 1 week plus history of flank back pain on the right side which radiates to the right hip. She describes pain as being predominantly in the right upper back flank area. It's been constant since the onset and is range severe in severity from moderate to severe anywhere from 5-8/10. She describes it as throbbing and aching denies any recent injuries no falls no recent surgeries no recent med changes. Was seen in the ED on 09/27/2014 for the same by Dr. Benjaman Lobe. She denies any history of renal colic or kidney stones.  She did have some (proximally 5 years after "fall she had a full workup that time which did not reveal any etiology for the pain. Reports his arthritis in the joints. She was started on Bactrim DS for UTI. Previous ER visit reviewed.   Past Medical History  Diagnosis Date  . Hypertension   . Back pain, chronic   . Arthritis     There are no active problems to display for this patient.   No past surgical history on file.  Current Outpatient Rx  Name  Route  Sig  Dispense  Refill  . diazepam (VALIUM) 5 MG tablet   Oral   Take 1 tablet (5 mg total) by mouth every 6 (six) hours as needed for muscle spasms.   21 tablet   0   . etodolac (LODINE) 500 MG tablet   Oral   Take 1 tablet (500 mg total) by mouth 2 (two) times daily.   60 tablet   0   . HYDROcodone-acetaminophen (NORCO/VICODIN) 5-325 MG per tablet   Oral   Take 1 tablet by mouth every 6 (six) hours as needed for moderate pain or severe pain.   12 tablet   0   .  sulfamethoxazole-trimethoprim (BACTRIM DS,SEPTRA DS) 800-160 MG per tablet   Oral   Take 1 tablet by mouth 2 (two) times daily.   10 tablet   0   . tiZANidine (ZANAFLEX) 4 MG tablet   Oral   Take 1 tablet (4 mg total) by mouth every 8 (eight) hours as needed for muscle spasms.   21 tablet   0     Allergies Review of patient's allergies indicates no known allergies.  No family history on file.  Social History History  Substance Use Topics  . Smoking status: Never Smoker   . Smokeless tobacco: Not on file  . Alcohol Use: No    Review of Systems Constitutional: No fever/chills Eyes: No visual changes. ENT: No sore throat. Cardiovascular: Denies chest pain. Respiratory: Denies shortness of breath. Gastrointestinal: No abdominal pain.  No nausea, no vomiting.  No diarrhea.  No constipation. Genitourinary: Negative for dysuria. Musculoskeletal: Positive for back pain. Skin: Negative for rash. Neurological: Negative for headaches, focal weakness or numbness.  10-point ROS otherwise negative.  ____________________________________________   PHYSICAL EXAM:  VITAL SIGNS: ED Triage Vitals  Enc Vitals Group     BP 10/03/14 0618 122/64 mmHg     Pulse Rate 10/03/14 0618 110     Resp --      Temp 10/03/14 0618 98.2 F (36.8 C)  Temp Source 10/03/14 0618 Oral     SpO2 10/03/14 0618 99 %     Weight 10/03/14 0618 225 lb (102.059 kg)     Height 10/03/14 0618 5\' 8"  (1.727 m)     Head Cir --      Peak Flow --      Pain Score 10/03/14 0620 10     Pain Loc --      Pain Edu? --      Excl. in [place]? --     Constitutional: Alert and oriented. Well appearing and in mild distress. Head: Atraumatic. Nose: No congestion/rhinnorhea. Mouth/Throat: Mucous membranes are moist.  Oropharynx non-erythematous. Neck: No stridor. No cervical spine tenderness to palpation. Hematological/Lymphatic/Immunilogical: No cervical lymphadenopathy. Cardiovascular: Normal rate, regular rhythm.  Grossly normal heart sounds.  Good peripheral circulation. Respiratory: Normal respiratory effort.  No retractions. Lungs CTAB. Gastrointestinal: Soft and nontender. No distention. No abdominal bruits. No CVA tenderness. Genitourinary: No suprapubic or CVAT at this time Musculoskeletal: No lower extremity tenderness nor edema.  No joint effusions. Neurologic:  Normal speech and language. No gross focal neurologic deficits are appreciated. Speech is normal. No gait instability. Skin:  Skin is warm, dry and intact. No rash noted. Psychiatric: Mood and affect are normal. Speech and behavior are normal.  ____________________________________________   LABS (all labs ordered are listed, but only abnormal results are displayed)  Labs Reviewed  URINALYSIS COMPLETEWITH MICROSCOPIC ([place])  - Abnormal; Notable for the following:    Color, Urine YELLOW (*)    APPearance CLEAR (*)    Leukocytes, UA TRACE (*)    Squamous Epithelial / LPF 0-5 (*)    All other components within normal limits   ____________________________________________  EKG  none ____________________________________________  RADIOLOGY  none ____________________________________________   PROCEDURES  Procedure(s) performed: None  Critical Care performed: No  ____________________________________________   INITIAL IMPRESSION / ASSESSMENT AND PLAN / ED COURSE  Pertinent labs & imaging results that were available during my care of the patient were reviewed by me and considered in my medical decision making (see chart for details).  Reviewed all laboratory results. Urinalysis has improved and resolved since previous 2 visits. Patient states improvement in pain after injection. Both discharge and follow-up plan medications as directed. No other EMC at this visit. ____________________________________________   FINAL CLINICAL IMPRESSION(S) / ED DIAGNOSES  Final diagnoses:  Low back pain with sciatica, sciatica  laterality unspecified, unspecified back pain laterality      Arlyss Repress, PA-C 10/03/14 3903  Orbie Pyo, MD 10/03/14 [phone]

## 2014-10-09 ENCOUNTER — Encounter: Payer: Self-pay | Admitting: Emergency Medicine

## 2014-10-09 ENCOUNTER — Emergency Department: Payer: No Typology Code available for payment source

## 2014-10-09 ENCOUNTER — Emergency Department
Admission: EM | Admit: 2014-10-09 | Discharge: 2014-10-09 | Disposition: A | Discharge status: Home / Self Care | Payer: No Typology Code available for payment source | Attending: Emergency Medicine | Admitting: Emergency Medicine

## 2014-10-09 DIAGNOSIS — R109 Unspecified abdominal pain: Secondary | ICD-10-CM | POA: Diagnosis present

## 2014-10-09 DIAGNOSIS — I10 Essential (primary) hypertension: Secondary | ICD-10-CM | POA: Insufficient documentation

## 2014-10-09 DIAGNOSIS — N200 Calculus of kidney: Secondary | ICD-10-CM | POA: Diagnosis not present

## 2014-10-09 DIAGNOSIS — Z791 Long term (current) use of non-steroidal anti-inflammatories (NSAID): Secondary | ICD-10-CM | POA: Insufficient documentation

## 2014-10-09 DIAGNOSIS — Z792 Long term (current) use of antibiotics: Secondary | ICD-10-CM | POA: Diagnosis not present

## 2014-10-09 DIAGNOSIS — R918 Other nonspecific abnormal finding of lung field: Secondary | ICD-10-CM

## 2014-10-09 DIAGNOSIS — G8929 Other chronic pain: Secondary | ICD-10-CM | POA: Diagnosis not present

## 2014-10-09 DIAGNOSIS — R911 Solitary pulmonary nodule: Secondary | ICD-10-CM | POA: Insufficient documentation

## 2014-10-09 LAB — URINALYSIS COMPLETE WITH MICROSCOPIC (ARMC ONLY)
Bacteria, UA: NONE SEEN
Glucose, UA: NEGATIVE mg/dL
Hgb urine dipstick: NEGATIVE
KETONES UR: NEGATIVE mg/dL
NITRITE: NEGATIVE
PH: 5 (ref 5.0–8.0)
Protein, ur: NEGATIVE mg/dL
SPECIFIC GRAVITY, URINE: 1.019 (ref 1.005–1.030)

## 2014-10-09 MED ORDER — TRAMADOL HCL 50 MG PO TABS
ORAL_TABLET | ORAL | Status: AC
  Administered 2014-10-09: 100 mg via ORAL
  Filled 2014-10-09: qty 2

## 2014-10-09 MED ORDER — TRAMADOL HCL 50 MG PO TABS
100.0000 mg | ORAL_TABLET | Freq: Once | ORAL | Status: AC
  Administered 2014-10-09: 100 mg via ORAL

## 2014-10-09 MED ORDER — TAMSULOSIN HCL 0.4 MG PO CAPS: 0.4000 mg | ORAL_CAPSULE | Freq: Every day | ORAL | Status: DC

## 2014-10-09 MED ORDER — TRAMADOL HCL 50 MG PO TABS: 50.0000 mg | ORAL_TABLET | Freq: Four times a day (QID) | ORAL | Status: AC | PRN

## 2014-10-09 NOTE — ED Provider Notes (Signed)
[facility] Emergency Department Provider Note  Time seen: 8:55 PM  I have reviewed the triage vital signs and the nursing notes.   HISTORY  Chief Complaint Back Pain    HPI Theresa Mercado is a 64 y.o. female with a past medical history of back pain, hypertension, arthritis who presents the emergency department with right flank pain. According to the patient she has had right flank pain for approximately 3 days, has gradually worsened. Denies any hematuria, dysuria, nausea, vomiting, diarrhea. Patient states the pain is moderate in severity, denies any worsening with movement. She believes it could be musculoskeletal pain although cannot remember any inciting event and denies worsening with movement.    Past Medical History  Diagnosis Date  . Hypertension   . Back pain, chronic   . Arthritis     There are no active problems to display for this patient.   Past Surgical History  Procedure Laterality Date  . Salivary gland surgery      Current Outpatient Rx  Name  Route  Sig  Dispense  Refill  . diazepam (VALIUM) 5 MG tablet   Oral   Take 1 tablet (5 mg total) by mouth every 6 (six) hours as needed for muscle spasms.   21 tablet   0   . etodolac (LODINE) 500 MG tablet   Oral   Take 1 tablet (500 mg total) by mouth 2 (two) times daily.   60 tablet   0   . HYDROcodone-acetaminophen (NORCO/VICODIN) 5-325 MG per tablet   Oral   Take 1 tablet by mouth every 6 (six) hours as needed for moderate pain or severe pain.   12 tablet   0   . sulfamethoxazole-trimethoprim (BACTRIM DS,SEPTRA DS) 800-160 MG per tablet   Oral   Take 1 tablet by mouth 2 (two) times daily.   10 tablet   0   . tiZANidine (ZANAFLEX) 4 MG tablet   Oral   Take 1 tablet (4 mg total) by mouth every 8 (eight) hours as needed for muscle spasms.   21 tablet   0     Allergies Review of patient's allergies indicates no known allergies.  No family history on file.  Social  History History  Substance Use Topics  . Smoking status: Never Smoker   . Smokeless tobacco: Not on file  . Alcohol Use: No    Review of Systems Constitutional: Negative for fever. Cardiovascular: Negative for chest pain. Respiratory: Negative for shortness of breath. Gastrointestinal:  positive for right flank pain, negative for vomiting and diarrhea. Genitourinary: Negative for dysuria or hematuria  Musculoskeletal:  positive for right back pain. 10-point ROS otherwise negative.  ____________________________________________   PHYSICAL EXAM:  VITAL SIGNS: ED Triage Vitals  Enc Vitals Group     BP 10/09/14 1945 92/66 mmHg     Pulse Rate 10/09/14 1945 107     Resp 10/09/14 1945 17     Temp 10/09/14 1945 97.5 F (36.4 C)     Temp Source 10/09/14 1945 Oral     SpO2 10/09/14 1945 100 %     Weight 10/09/14 1945 225 lb (102.059 kg)     Height 10/09/14 1945 5\' 8"  (1.727 m)     Head Cir --      Peak Flow --      Pain Score 10/09/14 1946 9     Pain Loc --      Pain Edu? --      Excl. in  [place]? --     Constitutional: Alert and oriented. Well appearing and in no distress. ENT   Mouth/Throat: Mucous membranes are moist. Cardiovascular: Normal rate, regular rhythm Respiratory: Normal respiratory effort without tachypnea nor retractions Gastrointestinal: Soft and nontender. No distention.   Mild right CVA tenderness to palpation Musculoskeletal: Nontender with normal range of motion in all extremities.  Neurologic:  Normal speech and language. No gross focal neurologic deficits  Skin:  Skin is warm, dry and intact.  Psychiatric: Mood and affect are normal. Speech and behavior are normal.  ____________________________________________    RADIOLOGY  IMPRESSION: 1. Positive for bilateral nephrolithiasis including a nonobstructing 5 mm stone in the left UPJ/proximal ureter. 2. Interval development of multiple bilateral pulmonary nodules which are new compared to 01/08/2014.  The appearance is concerning for metastatic disease although no primary malignancy is identified in the abdomen or pelvis. Does the patient have a known history of malignancy such as breast, skin or lung? Recommend further workup with non emergent (outpatient) CT scan of the chest. 3. Nonspecific sclerotic focus in the S3 vertebral body without prior imaging for comparison. Differential considerations include sclerotic metastatic disease, hemangioma and benign bone island. 4. 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  ____________________________________________   INITIAL IMPRESSION / ASSESSMENT AND PLAN / ED COURSE  Pertinent labs & imaging results that were available during my care of the patient were reviewed by me and considered in my medical decision making (see chart for details).  64 year old female with 3 days of right flank pain. Urinalysis shows 2+ leukocytes, and 6-30 red blood cells. We'll proceed with a CT renal study to evaluate for ureterolithiasis. ----------------------------------------- 9:58 PM on 10/09/2014 -----------------------------------------  CT consistent with multiple renal stones as well as a left ureteral stone. Patient also has multiple bilateral pulmonary nodules. 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 I discussed with the patient the need to follow up with [facility] for a PET scan as soon as possible. The patient is agreeable to plan. We will place the patient on Ultram as needed for pain, Flomax, and follow up with a urologist. Patient is agreeable. ____________________________________________   FINAL CLINICAL IMPRESSION(S) / ED DIAGNOSES  Right flank pain Nephrolithiasis Ureteral lithiasis Pulmonary nodules   Harvest Dark, MD 10/09/14 2159

## 2014-10-09 NOTE — ED Notes (Signed)
Patient c/o right-sided, mid-to-lower back pain for several days. States she thinks the pain is muscle spasms and she's had this occur before. Denies any dysuria, hematuria or urinary frequency. Patient doesn't remember any recent contributing injury.

## 2014-10-09 NOTE — Discharge Instructions (Signed)
Pulmonary Nodule  A pulmonary nodule is a small, round spot in your lung. It is usually found when pictures of your lungs are taken for other reasons. Most pulmonary nodules are not cancerous and do not cause symptoms. Tests will be done to make sure the nodule is not cancerous. Pulmonary nodules that are not cancerous usually do not require treatment. HOME CARE   Only take medicine as told by your doctor.  Follow up with your doctor as told. GET HELP IF:  You have trouble breathing when doing activities.  You feel sick or more tired than normal.  You do not feel like eating.  You lose weight without trying to.  You have chills.  You have night sweats. GET HELP RIGHT AWAY IF:  You cannot catch your breath.  You start making whistling sounds when breathing (wheezing).  You have a cough that does not go away.  You cough up blood.  You are dizzy or feel like you are going to pass out.  You have sudden chest pain.  You have a fever or lasting symptoms for more than 2-3 days.  You have a fever and your symptoms suddenly get worse. MAKE SURE YOU:  Understand these instructions.  Will watch your condition.  Will get help right away if you are not doing well or get worse. Document Released: 06/14/2010 Document Revised: 01/12/2013 Document Reviewed: 11/01/2012 [facility] Patient Information 2015 [place], [place]. This information is not intended to replace advice given to you by your health care provider. Make sure you discuss any questions you have with your health care provider.  Kidney Stones Kidney stones (urolithiasis) are deposits that form inside your kidneys. The intense pain is caused by the stone moving through the urinary tract. When the stone moves, the ureter goes into spasm around the stone. The stone is usually passed in the urine.  CAUSES   A disorder that makes certain neck glands produce too much parathyroid hormone (primary hyperparathyroidism).  A buildup  of uric acid crystals, similar to gout in your joints.  Narrowing (stricture) of the ureter.  A kidney obstruction present at birth (congenital obstruction).  Previous surgery on the kidney or ureters.  Numerous kidney infections. SYMPTOMS   Feeling sick to your stomach (nauseous).  Throwing up (vomiting).  Blood in the urine (hematuria).  Pain that usually spreads (radiates) to the groin.  Frequency or urgency of urination. DIAGNOSIS   Taking a history and physical exam.  Blood or urine tests.  CT scan.  Occasionally, an examination of the inside of the urinary bladder (cystoscopy) is performed. TREATMENT   Observation.  Increasing your fluid intake.  Extracorporeal shock wave lithotripsy--This is a noninvasive procedure that uses shock waves to break up kidney stones.  Surgery may be needed if you have severe pain or persistent obstruction. There are various surgical procedures. Most of the procedures are performed with the use of small instruments. Only small incisions are needed to accommodate these instruments, so recovery time is minimized. The size, location, and chemical composition are all important variables that will determine the proper choice of action for you. Talk to your health care provider to better understand your situation so that you will minimize the risk of injury to yourself and your kidney.  HOME CARE INSTRUCTIONS   Drink enough water and fluids to keep your urine clear or pale yellow. This will help you to pass the stone or stone fragments.  Strain all urine through the provided strainer. Keep all  particulate matter and stones for your health care provider to see. The stone causing the pain may be as small as a grain of salt. It is very important to use the strainer each and every time you pass your urine. The collection of your stone will allow your health care provider to analyze it and verify that a stone has actually passed. The stone analysis  will often identify what you can do to reduce the incidence of recurrences.  Only take over-the-counter or prescription medicines for pain, discomfort, or fever as directed by your health care provider.  Make a follow-up appointment with your health care provider as directed.  Get follow-up X-rays if required. The absence of pain does not always mean that the stone has passed. It may have only stopped moving. If the urine remains completely obstructed, it can cause loss of kidney function or even complete destruction of the kidney. It is your responsibility to make sure X-rays and follow-ups are completed. Ultrasounds of the kidney can show blockages and the status of the kidney. Ultrasounds are not associated with any radiation and can be performed easily in a matter of minutes. SEEK MEDICAL CARE IF:  You experience pain that is progressive and unresponsive to any pain medicine you have been prescribed. SEEK IMMEDIATE MEDICAL CARE IF:   Pain cannot be controlled with the prescribed medicine.  You have a fever or shaking chills.  The severity or intensity of pain increases over 18 hours and is not relieved by pain medicine.  You develop a new onset of abdominal pain.  You feel faint or pass out.  You are unable to urinate. MAKE SURE YOU:   Understand these instructions.  Will watch your condition.  Will get help right away if you are not doing well or get worse. Document Released: 05/12/2005 Document Revised: 01/12/2013 Document Reviewed: 10/13/2012 [facility] Patient Information 2015 [place], [place]. This information is not intended to replace advice given to you by your health care provider. Make sure you discuss any questions you have with your health care provider.

## 2014-10-11 NOTE — ED Provider Notes (Signed)
[facility] Emergency Department Provider Note ____________________________________________  Time seen: Approximately 07:23 AM  I have reviewed the triage vital signs and the nursing notes.   HISTORY  Chief Complaint Back Pain    HPI Theresa Mercado is a 64 y.o. female who presents for continued flank pain. She states that the symptoms have not resolved since her last visit. She denies recent injury. She denies pain with movement. She states that her urinalysis on previous studies has been negative for UTI.She states that this pain is different from the chronic back pain she experiences. She states that it is spasmodic in nature and unrelieved by any medication she has at home.   Past Medical History  Diagnosis Date  . Hypertension   . Back pain, chronic   . Arthritis     There are no active problems to display for this patient.   Past Surgical History  Procedure Laterality Date  . Salivary gland surgery      Current Outpatient Rx  Name  Route  Sig  Dispense  Refill  . diazepam (VALIUM) 5 MG tablet   Oral   Take 1 tablet (5 mg total) by mouth every 6 (six) hours as needed for muscle spasms.   21 tablet   0   . etodolac (LODINE) 500 MG tablet   Oral   Take 1 tablet (500 mg total) by mouth 2 (two) times daily.   60 tablet   0   . HYDROcodone-acetaminophen (NORCO/VICODIN) 5-325 MG per tablet   Oral   Take 1 tablet by mouth every 6 (six) hours as needed for moderate pain or severe pain.   12 tablet   0   . sulfamethoxazole-trimethoprim (BACTRIM DS,SEPTRA DS) 800-160 MG per tablet   Oral   Take 1 tablet by mouth 2 (two) times daily.   10 tablet   0   . tamsulosin (FLOMAX) 0.4 MG CAPS capsule   Oral   Take 1 capsule (0.4 mg total) by mouth daily.   30 capsule   0   . tiZANidine (ZANAFLEX) 4 MG tablet   Oral   Take 1 tablet (4 mg total) by mouth every 8 (eight) hours as needed for muscle spasms.   21 tablet   0   . traMADol  (ULTRAM) 50 MG tablet   Oral   Take 1 tablet (50 mg total) by mouth every 6 (six) hours as needed.   20 tablet   0     Allergies Review of patient's allergies indicates no known allergies.  No family history on file.  Social History History  Substance Use Topics  . Smoking status: Never Smoker   . Smokeless tobacco: Not on file  . Alcohol Use: No    Review of Systems Constitutional: No fever/chills Eyes: No visual changes. ENT: No sore throat. Cardiovascular: Denies chest pain. Respiratory: Denies shortness of breath. Gastrointestinal: No abdominal pain.  No nausea, no vomiting.  No diarrhea.  No constipation. Genitourinary: Negative for dysuria. Musculoskeletal: Positive for back pain. Skin: Negative for rash. Neurological: Negative for headaches, focal weakness or numbness.  10-point ROS otherwise negative.  ____________________________________________   PHYSICAL EXAM:  VITAL SIGNS: ED Triage Vitals  Enc Vitals Group     BP 10/01/14 0420 130/96 mmHg     Pulse Rate 10/01/14 0420 107     Resp 10/01/14 0420 22     Temp 10/01/14 0420 97.7 F (36.5 C)     Temp Source 10/01/14 0420 Oral  SpO2 10/01/14 0420 98 %     Weight 10/01/14 0420 225 lb (102.059 kg)     Height 10/01/14 0420 5\' 8"  (1.727 m)     Head Cir --      Peak Flow --      Pain Score 10/01/14 0421 9     Pain Loc --      Pain Edu? --      Excl. in [place]? --     Constitutional: Alert and oriented. Well appearing and in no acute distress. Eyes: Conjunctivae are normal. PERRL. EOMI. Head: Atraumatic. Nose: No congestion/rhinnorhea. Mouth/Throat: Mucous membranes are moist.  Oropharynx non-erythematous. Neck: No stridor.   Cardiovascular: Normal rate, regular rhythm. Grossly normal heart sounds.  Good peripheral circulation. Respiratory: Normal respiratory effort.  No retractions. Lungs CTAB. Gastrointestinal: Soft and nontender. No distention. No abdominal bruits. No CVA  tenderness. Genitourinary: Deferred Musculoskeletal: No lower extremity tenderness nor edema.  No joint effusions. No palpable muscle spasms or spasm visualized. Patient complains the pain is diffuse over the left flank and radiates up into the left thorax. Pain is not reproducible with palpation. Neurologic:  Normal speech and language. No gross focal neurologic deficits are appreciated. Speech is normal. No gait instability. Skin:  Skin is warm, dry and intact. No rash noted. Psychiatric: Mood and affect are normal. Speech and behavior are normal.  ____________________________________________   LABS (all labs ordered are listed, but only abnormal results are displayed)  Labs Reviewed  URINALYSIS COMPLETEWITH MICROSCOPIC ([place])  - Abnormal; Notable for the following:    Color, Urine AMBER (*)    APPearance CLOUDY (*)    Hgb urine dipstick 1+ (*)    Protein, ur 30 (*)    Leukocytes, UA 2+ (*)    Bacteria, UA RARE (*)    Squamous Epithelial / LPF 6-30 (*)    All other components within normal limits   _____________________WBC 6-30_______________________  EKG   ____________________________________________  RADIOLOGY   ____________________________________________   PROCEDURES  Procedure(s) performed: None  Critical Care performed: No  ____________________________________________   INITIAL IMPRESSION / ASSESSMENT AND PLAN / ED COURSE  Pertinent labs & imaging results that were available during my care of the patient were reviewed by me and considered in my medical decision making (see chart for details).  Patient was advised to follow-up with her primary care provider this week. She reports some improvement after IM Norflex although no spasms present on exam. She was advised to return to the emergency department for symptoms that change or worsen if she is unable to schedule an appointment with primary care. ____________________________________________   FINAL  CLINICAL IMPRESSION(S) / ED DIAGNOSES  Final diagnoses:  Urinary tract infection with hematuria, site unspecified      Victorino Dike, FNP 10/11/14 0013  Delman Kitten, MD 10/21/14 [phone]

## 2015-02-08 ENCOUNTER — Encounter: Payer: Self-pay | Admitting: Emergency Medicine

## 2015-02-08 ENCOUNTER — Emergency Department
Admission: EM | Admit: 2015-02-08 | Discharge: 2015-02-08 | Disposition: A | Discharge status: Home / Self Care | Payer: No Typology Code available for payment source | Attending: Emergency Medicine | Admitting: Emergency Medicine

## 2015-02-08 DIAGNOSIS — M545 Low back pain, unspecified: Secondary | ICD-10-CM

## 2015-02-08 DIAGNOSIS — I10 Essential (primary) hypertension: Secondary | ICD-10-CM | POA: Insufficient documentation

## 2015-02-08 DIAGNOSIS — Z79899 Other long term (current) drug therapy: Secondary | ICD-10-CM | POA: Diagnosis not present

## 2015-02-08 LAB — URINALYSIS COMPLETE WITH MICROSCOPIC (ARMC ONLY)
BILIRUBIN URINE: NEGATIVE
Glucose, UA: NEGATIVE mg/dL
HGB URINE DIPSTICK: NEGATIVE
KETONES UR: NEGATIVE mg/dL
LEUKOCYTES UA: NEGATIVE
NITRITE: NEGATIVE
PH: 6 (ref 5.0–8.0)
Protein, ur: NEGATIVE mg/dL
Specific Gravity, Urine: 1.005 (ref 1.005–1.030)

## 2015-02-08 MED ORDER — KETOROLAC TROMETHAMINE 10 MG PO TABS
10.0000 mg | ORAL_TABLET | Freq: Once | ORAL | Status: AC
  Administered 2015-02-08: 10 mg via ORAL

## 2015-02-08 MED ORDER — MORPHINE SULFATE (PF) 4 MG/ML IV SOLN
INTRAVENOUS | Status: AC
  Administered 2015-02-08: 4 mg via INTRAMUSCULAR
  Filled 2015-02-08: qty 1

## 2015-02-08 MED ORDER — KETOROLAC TROMETHAMINE 10 MG PO TABS
ORAL_TABLET | ORAL | Status: AC
  Administered 2015-02-08: 10 mg via ORAL
  Filled 2015-02-08: qty 1

## 2015-02-08 MED ORDER — CYCLOBENZAPRINE HCL 10 MG PO TABS: 10.0000 mg | ORAL_TABLET | Freq: Three times a day (TID) | ORAL | Status: DC | PRN

## 2015-02-08 MED ORDER — MORPHINE SULFATE (PF) 4 MG/ML IV SOLN
4.0000 mg | Freq: Once | INTRAVENOUS | Status: AC
  Administered 2015-02-08: 4 mg via INTRAMUSCULAR

## 2015-02-08 MED ORDER — CYCLOBENZAPRINE HCL 10 MG PO TABS
10.0000 mg | ORAL_TABLET | Freq: Once | ORAL | Status: AC
  Administered 2015-02-08: 10 mg via ORAL

## 2015-02-08 MED ORDER — CYCLOBENZAPRINE HCL 10 MG PO TABS
ORAL_TABLET | ORAL | Status: AC
  Administered 2015-02-08: 10 mg via ORAL
  Filled 2015-02-08: qty 1

## 2015-02-08 NOTE — ED Notes (Signed)
Pt to triage, via w/c with no distress; pt st since Sunday having lower back pain radiating into right hip; st hx back pain

## 2015-02-08 NOTE — Discharge Instructions (Signed)
Back Pain, Adult Low back pain is very common. About 1 in 5 people have back pain.The cause of low back pain is rarely dangerous. The pain often gets better over time.About half of people with a sudden onset of back pain feel better in just 2 weeks. About 8 in 10 people feel better by 6 weeks.  CAUSES Some common causes of back pain include:  Strain of the muscles or ligaments supporting the spine.  Wear and tear (degeneration) of the spinal discs.  Arthritis.  Direct injury to the back. DIAGNOSIS Most of the time, the direct cause of low back pain is not known.However, back pain can be treated effectively even when the exact cause of the pain is unknown.Answering your caregiver's questions about your overall health and symptoms is one of the most accurate ways to make sure the cause of your pain is not dangerous. If your caregiver needs more information, he or she may order lab work or imaging tests (X-rays or MRIs).However, even if imaging tests show changes in your back, this usually does not require surgery. HOME CARE INSTRUCTIONS For many people, back pain returns.Since low back pain is rarely dangerous, it is often a condition that people can learn to manageon their own.   Remain active. It is stressful on the back to sit or stand in one place. Do not sit, drive, or stand in one place for more than 30 minutes at a time. Take short walks on level surfaces as soon as pain allows.Try to increase the length of time you walk each day.  Do not stay in bed.Resting more than 1 or 2 days can delay your recovery.  Do not avoid exercise or work.Your body is made to move.It is not dangerous to be active, even though your back may hurt.Your back will likely heal faster if you return to being active before your pain is gone.  Pay attention to your body when you bend and lift. Many people have less discomfortwhen lifting if they bend their knees, keep the load close to their bodies,and  avoid twisting. Often, the most comfortable positions are those that put less stress on your recovering back.  Find a comfortable position to sleep. Use a firm mattress and lie on your side with your knees slightly bent. If you lie on your back, put a pillow under your knees.  Only take over-the-counter or prescription medicines as directed by your caregiver. Over-the-counter medicines to reduce pain and inflammation are often the most helpful.Your caregiver may prescribe muscle relaxant drugs.These medicines help dull your pain so you can more quickly return to your normal activities and healthy exercise.  Put ice on the injured area.  Put ice in a plastic bag.  Place a towel between your skin and the bag.  Leave the ice on for 15-20 minutes, 03-04 times a day for the first 2 to 3 days. After that, ice and heat may be alternated to reduce pain and spasms.  Ask your caregiver about trying back exercises and gentle massage. This may be of some benefit.  Avoid feeling anxious or stressed.Stress increases muscle tension and can worsen back pain.It is important to recognize when you are anxious or stressed and learn ways to manage it.Exercise is a great option. SEEK MEDICAL CARE IF:  You have pain that is not relieved with rest or medicine.  You have pain that does not improve in 1 week.  You have new symptoms.  You are generally not feeling well. SEEK   IMMEDIATE MEDICAL CARE IF:   You have pain that radiates from your back into your legs.  You develop new bowel or bladder control problems.  You have unusual weakness or numbness in your arms or legs.  You develop nausea or vomiting.  You develop abdominal pain.  You feel faint. Document Released: 05/12/2005 Document Revised: 11/11/2011 Document Reviewed: 09/13/2013 [employer] Patient Information 2015 [employer]. This information is not intended to replace advice given to you by your health care provider. Make sure you  discuss any questions you have with your health care provider.  

## 2015-02-08 NOTE — ED Notes (Signed)

## 2015-02-08 NOTE — ED Provider Notes (Signed)
[facility] Emergency Department Provider Note  ____________________________________________  Time seen: 4:00 AM  I have reviewed the triage vital signs and the nursing notes.   HISTORY  Chief Complaint Back Pain      HPI ENRIQUETA AUGUSTA is a 64 y.o. female presents with nontraumatic right lower back pain radiating to right hip since Sunday. Patient admits to previous history of "back problems".     Past Medical History  Diagnosis Date  . Hypertension   . Back pain, chronic   . Arthritis     There are no active problems to display for this patient.   Past Surgical History  Procedure Laterality Date  . Salivary gland surgery      Current Outpatient Rx  Name  Route  Sig  Dispense  Refill  . cyclobenzaprine (FLEXERIL) 10 MG tablet   Oral   Take 1 tablet (10 mg total) by mouth 3 (three) times daily as needed for muscle spasms.   30 tablet   0   . diazepam (VALIUM) 5 MG tablet   Oral   Take 1 tablet (5 mg total) by mouth every 6 (six) hours as needed for muscle spasms.   21 tablet   0   . etodolac (LODINE) 500 MG tablet   Oral   Take 1 tablet (500 mg total) by mouth 2 (two) times daily.   60 tablet   0   . HYDROcodone-acetaminophen (NORCO/VICODIN) 5-325 MG per tablet   Oral   Take 1 tablet by mouth every 6 (six) hours as needed for moderate pain or severe pain.   12 tablet   0   . sulfamethoxazole-trimethoprim (BACTRIM DS,SEPTRA DS) 800-160 MG per tablet   Oral   Take 1 tablet by mouth 2 (two) times daily.   10 tablet   0   . tamsulosin (FLOMAX) 0.4 MG CAPS capsule   Oral   Take 1 capsule (0.4 mg total) by mouth daily.   30 capsule   0   . tiZANidine (ZANAFLEX) 4 MG tablet   Oral   Take 1 tablet (4 mg total) by mouth every 8 (eight) hours as needed for muscle spasms.   21 tablet   0   . traMADol (ULTRAM) 50 MG tablet   Oral   Take 1 tablet (50 mg total) by mouth every 6 (six) hours as needed.   20 tablet   0      Allergies No known drug allergies  No family history on file.  Social History Social History  Substance Use Topics  . Smoking status: Never Smoker   . Smokeless tobacco: None  . Alcohol Use: No    Review of Systems  Constitutional: Negative for fever. Eyes: Negative for visual changes. ENT: Negative for sore throat. Cardiovascular: Negative for chest pain. Respiratory: Negative for shortness of breath. Gastrointestinal: Negative for abdominal pain, vomiting and diarrhea. Genitourinary: Negative for dysuria. Musculoskeletal: Positive for back pain Skin: Negative for rash. Neurological: Negative for headaches, focal weakness or numbness.   10-point ROS otherwise negative.  ____________________________________________   PHYSICAL EXAM:  VITAL SIGNS: ED Triage Vitals  Enc Vitals Group     BP --      Pulse --      Resp --      Temp --      Temp src --      SpO2 --      Weight 02/08/15 0141 176 lb (79.833 kg)     Height 02/08/15  0141 5\' 7"  (1.702 m)     Head Cir --      Peak Flow --      Pain Score 02/08/15 0141 9     Pain Loc --      Pain Edu? --      Excl. in [place]? --      Constitutional: Alert and oriented. Well appearing and in no distress. Eyes: Conjunctivae are normal. PERRL. Normal extraocular movements. ENT   Head: Normocephalic and atraumatic.   Nose: No congestion/rhinnorhea.   Mouth/Throat: Mucous membranes are moist.   Neck: No stridor. Hematological/Lymphatic/Immunilogical: No cervical lymphadenopathy. Cardiovascular: Normal rate, regular rhythm. Normal and symmetric distal pulses are present in all extremities. No murmurs, rubs, or gallops. Respiratory: Normal respiratory effort without tachypnea nor retractions. Breath sounds are clear and equal bilaterally. No wheezes/rales/rhonchi. Gastrointestinal: Soft and nontender. No distention. There is no CVA tenderness. Genitourinary: deferred Musculoskeletal: Nontender with normal  range of motion in all extremities. No joint effusions.  No lower extremity tenderness nor edema. Pain with palpation right paraspinal muscles in the lumbar region. Neurologic:  Normal speech and language. No gross focal neurologic deficits are appreciated. Speech is normal.  Skin:  Skin is warm, dry and intact. No rash noted. Psychiatric: Mood and affect are normal. Speech and behavior are normal. Patient exhibits appropriate insight and judgment.  ____________________________________________    LABS (pertinent positives/negatives)  Labs Reviewed  URINALYSIS COMPLETEWITH MICROSCOPIC ([place]) - Abnormal; Notable for the following:    Color, Urine COLORLESS (*)    APPearance CLEAR (*)    Bacteria, UA RARE (*)    Squamous Epithelial / LPF 0-5 (*)    All other components within normal limits        INITIAL IMPRESSION / ASSESSMENT AND PLAN / ED COURSE  Pertinent labs & imaging results that were available during my care of the patient were reviewed by me and considered in my medical decision making (see chart for details).  Physical exam consistent with chronic back pain. Patient received IM morphine with pain improvement  ____________________________________________   FINAL CLINICAL IMPRESSION(S) / ED DIAGNOSES  Final diagnoses:  Right-sided low back pain without sciatica      Gregor Hams, MD 02/08/15 [phone]

## 2015-02-09 DIAGNOSIS — G8929 Other chronic pain: Secondary | ICD-10-CM | POA: Diagnosis not present

## 2015-02-09 DIAGNOSIS — Z792 Long term (current) use of antibiotics: Secondary | ICD-10-CM | POA: Insufficient documentation

## 2015-02-09 DIAGNOSIS — Y9389 Activity, other specified: Secondary | ICD-10-CM | POA: Insufficient documentation

## 2015-02-09 DIAGNOSIS — S99912A Unspecified injury of left ankle, initial encounter: Secondary | ICD-10-CM | POA: Diagnosis not present

## 2015-02-09 DIAGNOSIS — S3992XA Unspecified injury of lower back, initial encounter: Secondary | ICD-10-CM | POA: Diagnosis present

## 2015-02-09 DIAGNOSIS — Y998 Other external cause status: Secondary | ICD-10-CM | POA: Diagnosis not present

## 2015-02-09 DIAGNOSIS — Z791 Long term (current) use of non-steroidal anti-inflammatories (NSAID): Secondary | ICD-10-CM | POA: Diagnosis not present

## 2015-02-09 DIAGNOSIS — I10 Essential (primary) hypertension: Secondary | ICD-10-CM | POA: Insufficient documentation

## 2015-02-09 DIAGNOSIS — S8991XA Unspecified injury of right lower leg, initial encounter: Secondary | ICD-10-CM | POA: Insufficient documentation

## 2015-02-09 DIAGNOSIS — T148 Other injury of unspecified body region: Secondary | ICD-10-CM | POA: Diagnosis not present

## 2015-02-09 DIAGNOSIS — Z79899 Other long term (current) drug therapy: Secondary | ICD-10-CM | POA: Diagnosis not present

## 2015-02-09 DIAGNOSIS — Y9289 Other specified places as the place of occurrence of the external cause: Secondary | ICD-10-CM | POA: Diagnosis not present

## 2015-02-09 DIAGNOSIS — W06XXXA Fall from bed, initial encounter: Secondary | ICD-10-CM | POA: Insufficient documentation

## 2015-02-09 DIAGNOSIS — S99911A Unspecified injury of right ankle, initial encounter: Secondary | ICD-10-CM | POA: Diagnosis not present

## 2015-02-09 DIAGNOSIS — S79911A Unspecified injury of right hip, initial encounter: Secondary | ICD-10-CM | POA: Diagnosis not present

## 2015-02-09 MED ORDER — IBUPROFEN 400 MG PO TABS
ORAL_TABLET | ORAL | Status: AC
  Filled 2015-02-09: qty 1

## 2015-02-09 MED ORDER — IBUPROFEN 400 MG PO TABS
400.0000 mg | ORAL_TABLET | Freq: Once | ORAL | Status: AC
  Administered 2015-02-09: 400 mg via ORAL

## 2015-02-09 NOTE — ED Notes (Signed)
Pt states was here wendsday night and fell out of strecher in ED. Pt states now entire right side is hurting. Cms intact in all extremities.

## 2015-02-10 ENCOUNTER — Emergency Department
Admission: EM | Admit: 2015-02-10 | Discharge: 2015-02-10 | Disposition: A | Discharge status: Home / Self Care | Payer: No Typology Code available for payment source | Attending: Emergency Medicine | Admitting: Emergency Medicine

## 2015-02-10 DIAGNOSIS — T148XXA Other injury of unspecified body region, initial encounter: Secondary | ICD-10-CM

## 2015-02-10 DIAGNOSIS — M545 Low back pain, unspecified: Secondary | ICD-10-CM

## 2015-02-10 MED ORDER — MORPHINE SULFATE (PF) 4 MG/ML IV SOLN
4.0000 mg | Freq: Once | INTRAVENOUS | Status: AC
  Administered 2015-02-10: 4 mg via INTRAMUSCULAR

## 2015-02-10 MED ORDER — MORPHINE SULFATE (PF) 4 MG/ML IV SOLN
INTRAVENOUS | Status: AC
  Filled 2015-02-10: qty 1

## 2015-02-10 NOTE — ED Notes (Signed)
Pt states has right shoulder, right elbow, right wrist, bilateral hip. Bilateral knee and bilateral ankle pain after falling out of bed 3 days pta. Cms intact in all extremities, no bruising or swelling noted. Skin normal color warm and dry.

## 2015-02-10 NOTE — ED Provider Notes (Signed)
[facility] Emergency Department Provider Note  ____________________________________________  Time seen: Approximately 2:29 AM  I have reviewed the triage vital signs and the nursing notes.   HISTORY  Chief Complaint Fall    HPI Theresa Mercado is a 64 y.o. female patient reports she was here on Wednesday for low back pain. She reports at that time she rolled over and fell out of the bed. I cannot find a record of that in the notes that I have access to here in the ER. She reports she has some pain in her right hip and right knee and both ankles and then she says it also hurts in the left knee patient has been walking normally knees are not bruised not swollen and not really tender to palpation bruise in the middle of the shin on the right leg there is no bony tenderness associated with that both ankles look essentially normal. Again patient has been walking she says she does not think she has broken anything moves her hips knees and ankles well spontaneously to show me that. And asked for more pain medicine says the Ultram she's got isn't working. I explained to her in some detail but he cannot treat chronic pain. She also begins complaining about her back pain. With anything now offered to give her some higher strength Ultram for a brief period time. She can follow up with her regular doctor.   Past Medical History  Diagnosis Date  . Hypertension   . Back pain, chronic   . Arthritis     There are no active problems to display for this patient.   Past Surgical History  Procedure Laterality Date  . Salivary gland surgery      Current Outpatient Rx  Name  Route  Sig  Dispense  Refill  . cyclobenzaprine (FLEXERIL) 10 MG tablet   Oral   Take 1 tablet (10 mg total) by mouth 3 (three) times daily as needed for muscle spasms.   30 tablet   0   . diazepam (VALIUM) 5 MG tablet   Oral   Take 1 tablet (5 mg total) by mouth every 6 (six) hours as needed for  muscle spasms.   21 tablet   0   . etodolac (LODINE) 500 MG tablet   Oral   Take 1 tablet (500 mg total) by mouth 2 (two) times daily.   60 tablet   0   . HYDROcodone-acetaminophen (NORCO/VICODIN) 5-325 MG per tablet   Oral   Take 1 tablet by mouth every 6 (six) hours as needed for moderate pain or severe pain.   12 tablet   0   . sulfamethoxazole-trimethoprim (BACTRIM DS,SEPTRA DS) 800-160 MG per tablet   Oral   Take 1 tablet by mouth 2 (two) times daily.   10 tablet   0   . tamsulosin (FLOMAX) 0.4 MG CAPS capsule   Oral   Take 1 capsule (0.4 mg total) by mouth daily.   30 capsule   0   . tiZANidine (ZANAFLEX) 4 MG tablet   Oral   Take 1 tablet (4 mg total) by mouth every 8 (eight) hours as needed for muscle spasms.   21 tablet   0   . traMADol (ULTRAM) 50 MG tablet   Oral   Take 1 tablet (50 mg total) by mouth every 6 (six) hours as needed.   20 tablet   0     Allergies Review of patient's allergies indicates no known  allergies.  No family history on file.  Social History Social History  Substance Use Topics  . Smoking status: Never Smoker   . Smokeless tobacco: Not on file  . Alcohol Use: No    Review of Systems Constitutional: No fever/chills Eyes: No visual changes. ENT: No sore throat. Cardiovascular: Denies chest pain. Respiratory: Denies shortness of breath. Gastrointestinal: No abdominal pain.  No nausea, no vomiting.  No diarrhea.  No constipation. Musculoskeletal: See history of present illness and old records Skin: Negative for rash. Neurological: Negative for headaches, focal weakness or numbness.   10-point ROS otherwise negative.  ____________________________________________   PHYSICAL EXAM:  VITAL SIGNS: ED Triage Vitals  Enc Vitals Group     BP 02/09/15 2236 130/85 mmHg     Pulse Rate 02/09/15 2236 100     Resp 02/09/15 2236 20     Temp 02/09/15 2236 98.6 F (37 C)     Temp Source 02/09/15 2236 Oral     SpO2 02/09/15  2236 100 %     Weight 02/09/15 2236 176 lb (79.833 kg)     Height 02/09/15 2236 5\' 7"  (1.702 m)     Head Cir --      Peak Flow --      Pain Score 02/09/15 2237 10     Pain Loc --      Pain Edu? --      Excl. in [place]? --     Constitutional: Alert and oriented. Well appearing and in no acute distress. Eyes: Conjunctivae are normal. PERRL. EOMI. Head: Atraumatic. Nose: No congestion/rhinnorhea. Mouth/Throat: Mucous membranes are moist.  Oropharynx non-erythematous. Cardiovascular: Normal rate, regular rhythm. Grossly normal heart sounds.  Good peripheral circulation. Respiratory: Normal respiratory effort.  No retractions.  Gastrointestinal: Soft and nontender. No distention. No abdominal bruits. No CVA tenderness. Musculoskeletal: As described in history of present illness there are no joint effusions or no joint swelling is not really bruising except for in the mid shin of the right tibia.  No joint effusions. Neurologic:  Normal speech and language. No gross focal neurologic deficits are appreciated. No gait instability. Skin:  Skin is warm, dry and intact. No rash noted. Psychiatric: Mood and affect are normal. Speech and behavior are normal.  ____________________________________________   LABS (all labs ordered are listed, but only abnormal results are displayed)  Labs Reviewed - No data to display ____________________________________________  EKG  ____________________________________________  RADIOLOGY   ____________________________________________   PROCEDURES   ____________________________________________   INITIAL IMPRESSION / ASSESSMENT AND PLAN / ED COURSE  Pertinent labs & imaging results that were available during my care of the patient were reviewed by me and considered in my medical decision making (see chart for details).   ____________________________________________   FINAL CLINICAL IMPRESSION(S) / ED DIAGNOSES  Final diagnoses:  Contusion   Right-sided low back pain without sciatica      Nena Polio, MD 02/10/15 [id number]

## 2015-02-10 NOTE — Discharge Instructions (Signed)
Back Pain, Adult Back pain is very common. The pain often gets better over time. The cause of back pain is usually not dangerous. Most people can learn to manage their back pain on their own.  HOME CARE   Stay active. Start with short walks on flat ground if you can. Try to walk farther each day.  Do not sit, drive, or stand in one place for more than 30 minutes. Do not stay in bed.  Do not avoid exercise or work. Activity can help your back heal faster.  Be careful when you bend or lift an object. Bend at your knees, keep the object close to you, and do not twist.  Sleep on a firm mattress. Lie on your side, and bend your knees. If you lie on your back, put a pillow under your knees.  Only take medicines as told by your doctor.  Put ice on the injured area.  Put ice in a plastic bag.  Place a towel between your skin and the bag.  Leave the ice on for 15-20 minutes, 03-04 times a day for the first 2 to 3 days. After that, you can switch between ice and heat packs.  Ask your doctor about back exercises or massage.  Avoid feeling anxious or stressed. Find good ways to deal with stress, such as exercise. GET HELP RIGHT AWAY IF:   Your pain does not go away with rest or medicine.  Your pain does not go away in 1 week.  You have new problems.  You do not feel well.  The pain spreads into your legs.  You cannot control when you poop (bowel movement) or pee (urinate).  Your arms or legs feel weak or lose feeling (numbness).  You feel sick to your stomach (nauseous) or throw up (vomit).  You have belly (abdominal) pain.  You feel like you may pass out (faint). MAKE SURE YOU:   Understand these instructions.  Will watch your condition.  Will get help right away if you are not doing well or get worse. Document Released: 10/29/2007 Document Revised: 08/04/2011 Document Reviewed: 09/13/2013 [facility] Patient Information 2015 [place], [place]. This information is not intended  to replace advice given to you by your health care provider. Make sure you discuss any questions you have with your health care provider. Please follow-up with your regular doctor can try to manage her pain better. Return as needed

## 2015-07-11 ENCOUNTER — Emergency Department
Admission: EM | Admit: 2015-07-11 | Discharge: 2015-07-11 | Disposition: A | Discharge status: Home / Self Care | Payer: BLUE CROSS/BLUE SHIELD | Attending: Emergency Medicine | Admitting: Emergency Medicine

## 2015-07-11 ENCOUNTER — Encounter: Payer: Self-pay | Admitting: Emergency Medicine

## 2015-07-11 DIAGNOSIS — M7918 Myalgia, other site: Secondary | ICD-10-CM

## 2015-07-11 DIAGNOSIS — Z79899 Other long term (current) drug therapy: Secondary | ICD-10-CM | POA: Diagnosis not present

## 2015-07-11 DIAGNOSIS — Z791 Long term (current) use of non-steroidal anti-inflammatories (NSAID): Secondary | ICD-10-CM | POA: Diagnosis not present

## 2015-07-11 DIAGNOSIS — I10 Essential (primary) hypertension: Secondary | ICD-10-CM | POA: Insufficient documentation

## 2015-07-11 DIAGNOSIS — Z792 Long term (current) use of antibiotics: Secondary | ICD-10-CM | POA: Diagnosis not present

## 2015-07-11 DIAGNOSIS — M545 Low back pain, unspecified: Secondary | ICD-10-CM

## 2015-07-11 DIAGNOSIS — G8929 Other chronic pain: Secondary | ICD-10-CM | POA: Diagnosis not present

## 2015-07-11 MED ORDER — DIAZEPAM 2 MG PO TABS: 2.0000 mg | ORAL_TABLET | Freq: Three times a day (TID) | ORAL | Status: DC | PRN

## 2015-07-11 NOTE — ED Notes (Signed)
C/o back pain, right lower back pain.   Patient states pain is like chronic back pain / muscle spasms.  Onset of symptoms Monday.

## 2015-07-11 NOTE — ED Notes (Signed)

## 2015-07-11 NOTE — Discharge Instructions (Signed)
Chronic Back Pain  When back pain lasts longer than 3 months, it is called chronic back pain.People with chronic back pain often go through certain periods that are more intense (flare-ups).  CAUSES Chronic back pain can be caused by wear and tear (degeneration) on different structures in your back. These structures include:  The bones of your spine (vertebrae) and the joints surrounding your spinal cord and nerve roots (facets).  The strong, fibrous tissues that connect your vertebrae (ligaments). Degeneration of these structures may result in pressure on your nerves. This can lead to constant pain. HOME CARE INSTRUCTIONS  Avoid bending, heavy lifting, prolonged sitting, and activities which make the problem worse.  Take brief periods of rest throughout the day to reduce your pain. Lying down or standing usually is better than sitting while you are resting.  Take over-the-counter or prescription medicines only as directed by your caregiver. SEEK IMMEDIATE MEDICAL CARE IF:   You have weakness or numbness in one of your legs or feet.  You have trouble controlling your bladder or bowels.  You have nausea, vomiting, abdominal pain, shortness of breath, or fainting.   This information is not intended to replace advice given to you by your health care provider. Make sure you discuss any questions you have with your health care provider.   Document Released: 06/19/2004 Document Revised: 08/04/2011 Document Reviewed: 10/30/2014 [employer] Interactive Patient Education 2016 [place] your home medicines as previously prescribed. Take the Valium as needed for muscle pain relief.  Follow-up with Dr. Sharlet Salina for continued pain.

## 2015-07-11 NOTE — ED Provider Notes (Signed)
[facility] Emergency Department Provider Note ____________________________________________  Time seen: 2250  I have reviewed the triage vital signs and the nursing notes.  HISTORY  Chief Complaint  Back Pain  HPI Theresa Mercado is a 65 y.o. female patient presents to the ED for evaluation of right low back pain and spasms that she describes. She reports a history of chronic low back pain, and that she is being managed by Dr. Sharlet Salina. She reports that her typical Ultram and Flexeril medications are not helping with her current complaint of spasms. She denies any injury, trauma, accident, fall. The only preceding factor that she can recall is climbing into her daughter's large truck yesterday. She rates her pain at 8/10 in triage. She denies any distal paresthesias, lower extremity referral, or incontinence. Patient is currently rolling around in bed writhing as if she is pain, pulmonary knees to her chest, and scratching her mid back describing muscle pain or spasm. She does is one of her recent visits here that the provider left the bed rail down on the examination bed, causing her to fall to the floor. She also notes that on previous visits to the ED she received morphine for complaint of low back pain.  Past Medical History  Diagnosis Date  . Hypertension   . Back pain, chronic   . Arthritis     There are no active problems to display for this patient.   Past Surgical History  Procedure Laterality Date  . Salivary gland surgery      Current Outpatient Rx  Name  Route  Sig  Dispense  Refill  . cyclobenzaprine (FLEXERIL) 10 MG tablet   Oral   Take 1 tablet (10 mg total) by mouth 3 (three) times daily as needed for muscle spasms.   30 tablet   0   . diazepam (VALIUM) 2 MG tablet   Oral   Take 1 tablet (2 mg total) by mouth every 8 (eight) hours as needed for muscle spasms.   10 tablet   0   . etodolac (LODINE) 500 MG tablet   Oral   Take 1 tablet  (500 mg total) by mouth 2 (two) times daily.   60 tablet   0   . HYDROcodone-acetaminophen (NORCO/VICODIN) 5-325 MG per tablet   Oral   Take 1 tablet by mouth every 6 (six) hours as needed for moderate pain or severe pain.   12 tablet   0   . sulfamethoxazole-trimethoprim (BACTRIM DS,SEPTRA DS) 800-160 MG per tablet   Oral   Take 1 tablet by mouth 2 (two) times daily.   10 tablet   0   . tamsulosin (FLOMAX) 0.4 MG CAPS capsule   Oral   Take 1 capsule (0.4 mg total) by mouth daily.   30 capsule   0   . tiZANidine (ZANAFLEX) 4 MG tablet   Oral   Take 1 tablet (4 mg total) by mouth every 8 (eight) hours as needed for muscle spasms.   21 tablet   0   . traMADol (ULTRAM) 50 MG tablet   Oral   Take 1 tablet (50 mg total) by mouth every 6 (six) hours as needed.   20 tablet   0    Allergies Review of patient's allergies indicates no known allergies.  No family history on file.  Social History Social History  Substance Use Topics  . Smoking status: Never Smoker   . Smokeless tobacco: None  . Alcohol Use: No  Review of Systems  Constitutional: Negative for fever. Eyes: Negative for visual changes. ENT: Negative for sore throat. Cardiovascular: Negative for chest pain. Respiratory: Negative for shortness of breath. Gastrointestinal: Negative for abdominal pain, vomiting and diarrhea. Genitourinary: Negative for dysuria. Musculoskeletal: Positive for back pain. Skin: Negative for rash. Neurological: Negative for headaches, focal weakness or numbness. ____________________________________________  PHYSICAL EXAM:  VITAL SIGNS: ED Triage Vitals  Enc Vitals Group     BP 07/11/15 2125 119/76 mmHg     Pulse Rate 07/11/15 2125 121     Resp 07/11/15 2125 20     Temp 07/11/15 2125 98.1 F (36.7 C)     Temp src --      SpO2 07/11/15 2125 95 %     Weight 07/11/15 2125 176 lb (79.833 kg)     Height 07/11/15 2125 5\' 7"  (1.702 m)     Head Cir --      Peak Flow --       Pain Score 07/11/15 2127 8     Pain Loc --      Pain Edu? --      Excl. in [place]? --    Constitutional: Alert and oriented. Well appearing and in no distress. Head: Normocephalic and atraumatic.      Eyes: Conjunctivae are normal. PERRL. Normal extraocular movements      Ears: Canals clear. TMs intact bilaterally.   Nose: No congestion/rhinorrhea.   Mouth/Throat: Mucous membranes are moist.   Neck: Supple. No thyromegaly. Hematological/Lymphatic/Immunological: No cervical lymphadenopathy. Cardiovascular: Normal rate, regular rhythm.  Respiratory: Normal respiratory effort. No wheezes/rales/rhonchi. Gastrointestinal: Soft and nontender. No distention. Musculoskeletal: Patient with normal spinal alignment no midline tenderness, spasm, deformity, or step-off. Patient with normal fluid transition from supine to sit. She is also able to transition from sit to stand without assistance.  She is able to demonstrate bilateral knee raises without difficulty. Nontender with normal range of motion in all extremities.  Neurologic: Cranial nerves II through XII grossly intact. Normal LE DTRs bilaterally. Normal gait without ataxia. Normal speech and language. No gross focal neurologic deficits are appreciated. Skin:  Skin is warm, dry and intact. No rash noted. Psychiatric: Mood and affect are normal. Patient exhibits appropriate insight and judgment. ____________________________________________  INITIAL IMPRESSION / ASSESSMENT AND PLAN / ED COURSE  Chronic low back pain without injury. Patient with acute spasm associate with her chronic low back pain. This been no injury or accident to provoke [place] to do any imaging at this time. Patient with a request for pain medicine despite her current prescriptions for Flexeril and Ultram. She is advised that she will not receive narcotics or a morphine shot as requested. She will be discharged with a prescription for Valium 5 mg #10 to dose for acute muscle  spasms. Follow-up with Dr. Sharlet Salina as needed.  ____________________________________________  FINAL CLINICAL IMPRESSION(S) / ED DIAGNOSES  Final diagnoses:  Chronic low back pain  Myofacial muscle pain      Melvenia Needles, PA-C 07/11/15 2315  Ponciano Ort, MD 07/11/15 2338

## 2015-07-11 NOTE — ED Notes (Signed)
Pt took vicodin without relief at home

## 2015-07-12 ENCOUNTER — Encounter: Payer: Self-pay | Admitting: Emergency Medicine

## 2015-07-12 ENCOUNTER — Emergency Department
Admission: EM | Admit: 2015-07-12 | Discharge: 2015-07-12 | Disposition: A | Discharge status: Home / Self Care | Payer: BLUE CROSS/BLUE SHIELD | Attending: Emergency Medicine | Admitting: Emergency Medicine

## 2015-07-12 DIAGNOSIS — Z79899 Other long term (current) drug therapy: Secondary | ICD-10-CM | POA: Insufficient documentation

## 2015-07-12 DIAGNOSIS — G8929 Other chronic pain: Secondary | ICD-10-CM | POA: Diagnosis not present

## 2015-07-12 DIAGNOSIS — Z791 Long term (current) use of non-steroidal anti-inflammatories (NSAID): Secondary | ICD-10-CM | POA: Diagnosis not present

## 2015-07-12 DIAGNOSIS — M546 Pain in thoracic spine: Secondary | ICD-10-CM | POA: Insufficient documentation

## 2015-07-12 DIAGNOSIS — I10 Essential (primary) hypertension: Secondary | ICD-10-CM | POA: Diagnosis not present

## 2015-07-12 DIAGNOSIS — M545 Low back pain: Secondary | ICD-10-CM | POA: Insufficient documentation

## 2015-07-12 DIAGNOSIS — M62830 Muscle spasm of back: Secondary | ICD-10-CM | POA: Insufficient documentation

## 2015-07-12 DIAGNOSIS — M549 Dorsalgia, unspecified: Secondary | ICD-10-CM

## 2015-07-12 MED ORDER — KETOROLAC TROMETHAMINE 60 MG/2ML IM SOLN
INTRAMUSCULAR | Status: AC
  Administered 2015-07-12: 60 mg via INTRAMUSCULAR
  Filled 2015-07-12: qty 2

## 2015-07-12 MED ORDER — KETOROLAC TROMETHAMINE 60 MG/2ML IM SOLN
60.0000 mg | Freq: Once | INTRAMUSCULAR | Status: AC
  Administered 2015-07-12: 60 mg via INTRAMUSCULAR

## 2015-07-12 NOTE — Discharge Instructions (Signed)
Continue taking your medication only as prescribed. Call your doctor today for further instructions on your pain medication. You may use ice to your back as needed for muscle spasms and back pain.

## 2015-07-12 NOTE — ED Notes (Signed)
See triage  Was seen last pm for same..states spasms is still in lower back and radiates into right side

## 2015-07-12 NOTE — ED Notes (Signed)
Muscle spasms to right lower back and down right leg.

## 2015-07-12 NOTE — ED Provider Notes (Signed)
[facility] Emergency Department Provider Note  ____________________________________________  Time seen: Approximately 8:16 AM  I have reviewed the triage vital signs and the nursing notes.   HISTORY  Chief Complaint Spasms   HPI Theresa Mercado is a 65 y.o. female is here with complaint of back pain and spasms. Patient was seen here last night for the same. She states that this morning approximate 2 or 3:00 she took one Valium and 1 Percocet without any relief. She states there is been no injury since being seen last night. She has not called Dr. Saralyn Pilar office this morning. She states she has an appointment next week. She denies any changes from her exam of last night. She states that they did not give her any morphine for her pain last night.She denies any urinary symptoms. She rates her pain as an 8 out of 10. Patient is not been incontinent of bowel or bladder.   Past Medical History  Diagnosis Date  . Hypertension   . Back pain, chronic   . Arthritis     There are no active problems to display for this patient.   Past Surgical History  Procedure Laterality Date  . Salivary gland surgery      Current Outpatient Rx  Name  Route  Sig  Dispense  Refill  . cyclobenzaprine (FLEXERIL) 10 MG tablet   Oral   Take 1 tablet (10 mg total) by mouth 3 (three) times daily as needed for muscle spasms.   30 tablet   0   . diazepam (VALIUM) 2 MG tablet   Oral   Take 1 tablet (2 mg total) by mouth every 8 (eight) hours as needed for muscle spasms.   10 tablet   0   . etodolac (LODINE) 500 MG tablet   Oral   Take 1 tablet (500 mg total) by mouth 2 (two) times daily.   60 tablet   0   . HYDROcodone-acetaminophen (NORCO/VICODIN) 5-325 MG per tablet   Oral   Take 1 tablet by mouth every 6 (six) hours as needed for moderate pain or severe pain.   12 tablet   0   . sulfamethoxazole-trimethoprim (BACTRIM DS,SEPTRA DS) 800-160 MG per tablet   Oral  Take 1 tablet by mouth 2 (two) times daily.   10 tablet   0   . tamsulosin (FLOMAX) 0.4 MG CAPS capsule   Oral   Take 1 capsule (0.4 mg total) by mouth daily.   30 capsule   0   . tiZANidine (ZANAFLEX) 4 MG tablet   Oral   Take 1 tablet (4 mg total) by mouth every 8 (eight) hours as needed for muscle spasms.   21 tablet   0   . traMADol (ULTRAM) 50 MG tablet   Oral   Take 1 tablet (50 mg total) by mouth every 6 (six) hours as needed.   20 tablet   0     Allergies Review of patient's allergies indicates no known allergies.  No family history on file.  Social History Social History  Substance Use Topics  . Smoking status: Never Smoker   . Smokeless tobacco: None  . Alcohol Use: No    Review of Systems Constitutional: No fever/chills Cardiovascular: Denies chest pain. Respiratory: Denies shortness of breath. Gastrointestinal: No abdominal pain.  No nausea, no vomiting.   Musculoskeletal: Negative for back pain. Skin: Negative for rash. Neurological: Negative for headaches, focal weakness or numbness.  10-point ROS otherwise negative.  ____________________________________________  PHYSICAL EXAM:  VITAL SIGNS: ED Triage Vitals  Enc Vitals Group     BP 07/12/15 0729 127/63 mmHg     Pulse Rate 07/12/15 0729 137     Resp 07/12/15 0729 18     Temp 07/12/15 0729 98 F (36.7 C)     Temp Source 07/12/15 0729 Oral     SpO2 07/12/15 0729 97 %     Weight 07/12/15 0729 176 lb (79.833 kg)     Height 07/12/15 0729 5\' 8"  (1.727 m)     Head Cir --      Peak Flow --      Pain Score 07/12/15 0731 8     Pain Loc --      Pain Edu? --      Excl. in [place]? --     Constitutional: Alert and oriented. Well appearing and in no acute distress. Patient is frequently moving around on the stretcher with her back pain and "spasms". Eyes: Conjunctivae are normal. PERRL. EOMI. Head: Atraumatic. Nose: No congestion/rhinnorhea. Neck: No stridor.  Range of motion is without  restriction. Cardiovascular: Normal rate, regular rhythm. Grossly normal heart sounds.  Good peripheral circulation. Respiratory: Normal respiratory effort.  No retractions. Lungs CTAB. Gastrointestinal: Soft and nontender. No distention. Bowel sounds present and normal by 4 quadrants. Musculoskeletal: No gross deformities noted of the back. There are no active spasm seen. Patient is moderately tender to light palpation in the thoracic and lumbar paravertebral muscle regions bilaterally. Neurologic:  Normal speech and language. No gross focal neurologic deficits are appreciated. No gait instability. Reflexes 1+ bilaterally. Skin:  Skin is warm, dry and intact. No rash noted. Psychiatric: Mood and affect are normal. Speech and behavior are normal.  ____________________________________________   LABS (all labs ordered are listed, but only abnormal results are displayed)  Labs Reviewed - No data to display  PROCEDURES  Procedure(s) performed: None  Critical Care performed: No  ____________________________________________   INITIAL IMPRESSION / ASSESSMENT AND PLAN / ED COURSE  Pertinent labs & imaging results that were available during my care of the patient were reviewed by me and considered in my medical decision making (see chart for details).  Patient was given Toradol 60 mg IM in the emergency room and was told to follow-up with her doctor as instructed yesterday. She has medication at home and has this medication with her in the emergency room and appears to have multiple pills in each bottle that she may take until she is able talk to her doctor. ____________________________________________   FINAL CLINICAL IMPRESSION(S) / ED DIAGNOSES  Final diagnoses:  Chronic back pain      Johnn Hai, PA-C 07/12/15 1345  Earleen Newport, MD 07/12/15 1520

## 2015-07-14 ENCOUNTER — Encounter: Payer: Self-pay | Admitting: *Deleted

## 2015-07-14 DIAGNOSIS — G8929 Other chronic pain: Secondary | ICD-10-CM | POA: Insufficient documentation

## 2015-07-14 DIAGNOSIS — M5441 Lumbago with sciatica, right side: Secondary | ICD-10-CM | POA: Insufficient documentation

## 2015-07-14 DIAGNOSIS — Z792 Long term (current) use of antibiotics: Secondary | ICD-10-CM | POA: Diagnosis not present

## 2015-07-14 DIAGNOSIS — I10 Essential (primary) hypertension: Secondary | ICD-10-CM | POA: Diagnosis not present

## 2015-07-14 DIAGNOSIS — M545 Low back pain: Secondary | ICD-10-CM | POA: Diagnosis present

## 2015-07-14 DIAGNOSIS — Z79899 Other long term (current) drug therapy: Secondary | ICD-10-CM | POA: Insufficient documentation

## 2015-07-14 DIAGNOSIS — Z7952 Long term (current) use of systemic steroids: Secondary | ICD-10-CM | POA: Insufficient documentation

## 2015-07-14 NOTE — ED Notes (Signed)
Pt arrived to ED reporting chronic back pain that has been getting worse over the past week. Pt has been seen in ED multiple times this week and reported the "shots" that were given have not helped the pain pt reports bilateral leg pain has become worse over the past week as well. Pt reports right knee pain that started after pt feel out of bed in ED. Pt states she fell because "the bed rails were not up". Pt able to walk in triage and is kicking legs around with no difficulty.

## 2015-07-15 ENCOUNTER — Emergency Department
Admission: EM | Admit: 2015-07-15 | Discharge: 2015-07-15 | Disposition: A | Discharge status: Home / Self Care | Payer: BLUE CROSS/BLUE SHIELD | Attending: Emergency Medicine | Admitting: Emergency Medicine

## 2015-07-15 DIAGNOSIS — G8929 Other chronic pain: Secondary | ICD-10-CM

## 2015-07-15 DIAGNOSIS — M549 Dorsalgia, unspecified: Secondary | ICD-10-CM

## 2015-07-15 DIAGNOSIS — M5441 Lumbago with sciatica, right side: Secondary | ICD-10-CM

## 2015-07-15 LAB — URINALYSIS COMPLETE WITH MICROSCOPIC (ARMC ONLY)
BILIRUBIN URINE: NEGATIVE
GLUCOSE, UA: NEGATIVE mg/dL
NITRITE: NEGATIVE
PH: 5 (ref 5.0–8.0)
Protein, ur: 30 mg/dL — AB
Specific Gravity, Urine: 1.024 (ref 1.005–1.030)

## 2015-07-15 MED ORDER — LIDOCAINE 5 % EX PTCH: 1.0000 | MEDICATED_PATCH | CUTANEOUS | Status: DC

## 2015-07-15 MED ORDER — PREDNISONE 20 MG PO TABS
60.0000 mg | ORAL_TABLET | Freq: Once | ORAL | Status: AC
  Administered 2015-07-15: 60 mg via ORAL
  Filled 2015-07-15: qty 3

## 2015-07-15 MED ORDER — PREDNISONE 20 MG PO TABS: 60.0000 mg | ORAL_TABLET | Freq: Every day | ORAL | Status: DC

## 2015-07-15 MED ORDER — LIDOCAINE 5 % EX PTCH
1.0000 | MEDICATED_PATCH | CUTANEOUS | Status: DC
  Administered 2015-07-15: 1 via TRANSDERMAL
  Filled 2015-07-15 (×2): qty 1

## 2015-07-15 NOTE — ED Provider Notes (Addendum)
[facility] Emergency Department Provider Note  ____________________________________________  Time seen: Approximately 258 AM  I have reviewed the triage vital signs and the nursing notes.   HISTORY  Chief Complaint Back Pain    HPI Theresa Mercado is a 65 y.o. female who comes into the hospital today with back problems. The patient reports that she is having some bad muscle spasms. She reports that the pain goes down to her hip and her leg. The patient has been here she reports 3 times this week for the same complaints. The patient has been taking some Percocet and Valium but reports that it was not helping. The patient reports that although she's been here she has not followed up with her pain doctor or her back doctor. The patient has an appointment on Tuesday. She did not contact her doctor to let them know she was having worse symptoms. The patient reports that she's received shots in her back for her pain but the last time was 6 months ago. The patient has a history of lung cancer and was seen by her radiation oncologist on 214. The patient had a CT scan which showed some new spots of cancer on her lungs but her doctor did schedule an MRI to be done this week to evaluate her chronic back pain. The patient denies any urinary or fecal incontinence at this time. The patient rates her pain as an 8 out of 10 in intensity. She reports that she has also taking tramadol and Flexeril for her pain but again it has not been helping.   Past Medical History  Diagnosis Date  . Hypertension   . Back pain, chronic   . Arthritis     There are no active problems to display for this patient.   Past Surgical History  Procedure Laterality Date  . Salivary gland surgery      Current Outpatient Rx  Name  Route  Sig  Dispense  Refill  . cyclobenzaprine (FLEXERIL) 10 MG tablet   Oral   Take 1 tablet (10 mg total) by mouth 3 (three) times daily as needed for muscle spasms.  30 tablet   0   . diazepam (VALIUM) 2 MG tablet   Oral   Take 1 tablet (2 mg total) by mouth every 8 (eight) hours as needed for muscle spasms.   10 tablet   0   . etodolac (LODINE) 500 MG tablet   Oral   Take 1 tablet (500 mg total) by mouth 2 (two) times daily.   60 tablet   0   . HYDROcodone-acetaminophen (NORCO/VICODIN) 5-325 MG per tablet   Oral   Take 1 tablet by mouth every 6 (six) hours as needed for moderate pain or severe pain.   12 tablet   0   . lidocaine (LIDODERM) 5 %   Transdermal   Place 1 patch onto the skin daily. Remove & Discard patch within 12 hours or as directed by MD   10 patch   0   . predniSONE (DELTASONE) 20 MG tablet   Oral   Take 3 tablets (60 mg total) by mouth daily.   12 tablet   0   . sulfamethoxazole-trimethoprim (BACTRIM DS,SEPTRA DS) 800-160 MG per tablet   Oral   Take 1 tablet by mouth 2 (two) times daily.   10 tablet   0   . tamsulosin (FLOMAX) 0.4 MG CAPS capsule   Oral   Take 1 capsule (0.4 mg total) by  mouth daily.   30 capsule   0   . tiZANidine (ZANAFLEX) 4 MG tablet   Oral   Take 1 tablet (4 mg total) by mouth every 8 (eight) hours as needed for muscle spasms.   21 tablet   0   . traMADol (ULTRAM) 50 MG tablet   Oral   Take 1 tablet (50 mg total) by mouth every 6 (six) hours as needed.   20 tablet   0     Allergies Review of patient's allergies indicates no known allergies.  History reviewed. No pertinent family history.  Social History Social History  Substance Use Topics  . Smoking status: Never Smoker   . Smokeless tobacco: None  . Alcohol Use: No    Review of Systems Constitutional: No fever/chills Eyes: No visual changes. ENT: No sore throat. Cardiovascular: Denies chest pain. Respiratory: Denies shortness of breath. Gastrointestinal: No abdominal pain.  No nausea, no vomiting.  No diarrhea.  No constipation. Genitourinary: Negative for dysuria. Musculoskeletal: back pain. Skin: Negative  for rash. Neurological: Negative for headaches, focal weakness or numbness.  10-point ROS otherwise negative.  ____________________________________________   PHYSICAL EXAM:  VITAL SIGNS: ED Triage Vitals  Enc Vitals Group     BP 07/14/15 2239 129/68 mmHg     Pulse Rate 07/14/15 2239 108     Resp 07/14/15 2239 18     Temp 07/14/15 2239 98.2 F (36.8 C)     Temp Source 07/14/15 2239 Oral     SpO2 07/14/15 2239 97 %     Weight 07/14/15 2239 176 lb (79.833 kg)     Height 07/14/15 2239 5\' 7"  (1.702 m)     Head Cir --      Peak Flow --      Pain Score 07/14/15 2241 8     Pain Loc --      Pain Edu? --      Excl. in [place]? --     Constitutional: Alert and oriented. Well appearing and in moderate distress. Eyes: Conjunctivae are normal. PERRL. EOMI. Head: Atraumatic. Nose: No congestion/rhinnorhea. Mouth/Throat: Mucous membranes are moist.  Oropharynx non-erythematous. Cardiovascular: Normal rate, regular rhythm. Grossly normal heart sounds.  Good peripheral circulation. Respiratory: Normal respiratory effort.  No retractions. Lungs CTAB. Gastrointestinal: Soft and nontender. No distention. Positive bowel sounds Musculoskeletal: No lower extremity tenderness nor edema.  Tenderness to palpation along the mid spine from lower thoracic to lumbar spine as well as right sided sciatica and muscle tenderness to palpation. Neurologic:  Normal speech and language. No gross focal neurologic deficits are appreciated. Skin:  Skin is warm, dry and intact. Psychiatric: Mood and affect are normal.   ____________________________________________   LABS (all labs ordered are listed, but only abnormal results are displayed)  Labs Reviewed  URINALYSIS COMPLETEWITH MICROSCOPIC ([facility] ONLY) - Abnormal; Notable for the following:    Color, Urine YELLOW (*)    APPearance CLOUDY (*)    Ketones, ur 1+ (*)    Hgb urine dipstick 2+ (*)    Protein, ur 30 (*)    Leukocytes, UA 2+ (*)    Bacteria, UA  RARE (*)    Squamous Epithelial / LPF 6-30 (*)    All other components within normal limits   ____________________________________________  EKG  ED ECG REPORT I, Loney Hering, the attending physician, personally viewed and interpreted this ECG.   Date: 07/04/2015  EKG Time: 2237  Rate: 111  Rhythm: sinus tachycardia  Axis: normal  Intervals:none  ST&T Change: none  ____________________________________________  RADIOLOGY  None ____________________________________________   PROCEDURES  Procedure(s) performed: None  Critical Care performed: No  ____________________________________________   INITIAL IMPRESSION / ASSESSMENT AND PLAN / ED COURSE  Pertinent labs & imaging results that were available during my care of the patient were reviewed by me and considered in my medical decision making (see chart for details).  This is 65 year old female who comes into the hospital today with some back pain. The patient has a history of chronic back pain as well as some lung cancer. The patient has been seeing her doctor and she reports she has an appointment with her pain management doctor on Tuesday as well as a scheduled MRI for Friday. I did inform the patient that given her history of chronic back pain and will not be able to treat her pain with narcotics but I will give her some steroids and a Lidoderm patch for her back. I will reassess the patient when she's had the medications.  On reassessment the patient was sleeping and her pain was controlled. She'll be discharged to home to follow-up with her pain medicine doctor who will evaluate the patient's pain and her symptoms. The patient will be discharged to home.  The patient's urinalysis did show too numerous to count red blood cells with rare bacteria and 6 with 30 white blood cells. At this time the patient's pain is improved so I will encourage her to hydrate and have her follow back up with her doctor for further  evaluation. I will send a urine culture as well. ____________________________________________   FINAL CLINICAL IMPRESSION(S) / ED DIAGNOSES  Final diagnoses:  Right-sided low back pain with right-sided sciatica  Chronic back pain      Loney Hering, MD 07/15/15 0459  Loney Hering, MD 07/15/15 0502

## 2015-07-15 NOTE — Discharge Instructions (Signed)
Chronic Back Pain  When back pain lasts longer than 3 months, it is called chronic back pain.People with chronic back pain often go through certain periods that are more intense (flare-ups).  CAUSES Chronic back pain can be caused by wear and tear (degeneration) on different structures in your back. These structures include:  The bones of your spine (vertebrae) and the joints surrounding your spinal cord and nerve roots (facets).  The strong, fibrous tissues that connect your vertebrae (ligaments). Degeneration of these structures may result in pressure on your nerves. This can lead to constant pain. HOME CARE INSTRUCTIONS  Avoid bending, heavy lifting, prolonged sitting, and activities which make the problem worse.  Take brief periods of rest throughout the day to reduce your pain. Lying down or standing usually is better than sitting while you are resting.  Take over-the-counter or prescription medicines only as directed by your caregiver. SEEK IMMEDIATE MEDICAL CARE IF:   You have weakness or numbness in one of your legs or feet.  You have trouble controlling your bladder or bowels.  You have nausea, vomiting, abdominal pain, shortness of breath, or fainting.   This information is not intended to replace advice given to you by your health care provider. Make sure you discuss any questions you have with your health care provider.   Document Released: 06/19/2004 Document Revised: 08/04/2011 Document Reviewed: 10/30/2014 Elsevier Interactive Patient Education 2016 [employer]  Radicular Pain Radicular pain in either the arm or leg is usually from a bulging or herniated disk in the spine. A piece of the herniated disk may press against the nerves as the nerves exit the spine. This causes pain which is felt at the tips of the nerves down the arm or leg. Other causes of radicular pain may include:  Fractures.  Heart disease.  Cancer.  An abnormal and usually degenerative state  of the nervous system or nerves (neuropathy). Diagnosis may require CT or MRI scanning to determine the primary cause.  Nerves that start at the neck (nerve roots) may cause radicular pain in the outer shoulder and arm. It can spread down to the thumb and fingers. The symptoms vary depending on which nerve root has been affected. In most cases radicular pain improves with conservative treatment. Neck problems may require physical therapy, a neck collar, or cervical traction. Treatment may take many weeks, and surgery may be considered if the symptoms do not improve.  Conservative treatment is also recommended for sciatica. Sciatica causes pain to radiate from the lower back or buttock area down the leg into the foot. Often there is a history of back problems. Most patients with sciatica are better after 2 to 4 weeks of rest and other supportive care. Short term bed rest can reduce the disk pressure considerably. Sitting, however, is not a good position since this increases the pressure on the disk. You should avoid bending, lifting, and all other activities which make the problem worse. Traction can be used in severe cases. Surgery is usually reserved for patients who do not improve within the first months of treatment. Only take over-the-counter or prescription medicines for pain, discomfort, or fever as directed by your caregiver. Narcotics and muscle relaxants may help by relieving more severe pain and spasm and by providing mild sedation. Cold or massage can give significant relief. Spinal manipulation is not recommended. It can increase the degree of disc protrusion. Epidural steroid injections are often effective treatment for radicular pain. These injections deliver medicine to the spinal  nerve in the space between the protective covering of the spinal cord and back bones (vertebrae). Your caregiver can give you more information about steroid injections. These injections are most effective when given  within two weeks of the onset of pain.  You should see your caregiver for follow up care as recommended. A program for neck and back injury rehabilitation with stretching and strengthening exercises is an important part of management.  SEEK IMMEDIATE MEDICAL CARE IF:  You develop increased pain, weakness, or numbness in your arm or leg.  You develop difficulty with bladder or bowel control.  You develop abdominal pain.   This information is not intended to replace advice given to you by your health care provider. Make sure you discuss any questions you have with your health care provider.   Document Released: 06/19/2004 Document Revised: 06/02/2014 Document Reviewed: 12/06/2014 Elsevier Interactive Patient Education 2016 [employer]  Sciatica Sciatica is pain, weakness, numbness, or tingling along the path of the sciatic nerve. The nerve starts in the lower back and runs down the back of each leg. The nerve controls the muscles in the lower leg and in the back of the knee, while also providing sensation to the back of the thigh, lower leg, and the sole of your foot. Sciatica is a symptom of another medical condition. For instance, nerve damage or certain conditions, such as a herniated disk or bone spur on the spine, pinch or put pressure on the sciatic nerve. This causes the pain, weakness, or other sensations normally associated with sciatica. Generally, sciatica only affects one side of the body. CAUSES   Herniated or slipped disc.  Degenerative disk disease.  A pain disorder involving the narrow muscle in the buttocks (piriformis syndrome).  Pelvic injury or fracture.  Pregnancy.  Tumor (rare). SYMPTOMS  Symptoms can vary from mild to very severe. The symptoms usually travel from the low back to the buttocks and down the back of the leg. Symptoms can include:  Mild tingling or dull aches in the lower back, leg, or hip.  Numbness in the back of the calf or sole of the  foot.  Burning sensations in the lower back, leg, or hip.  Sharp pains in the lower back, leg, or hip.  Leg weakness.  Severe back pain inhibiting movement. These symptoms may get worse with coughing, sneezing, laughing, or prolonged sitting or standing. Also, being overweight may worsen symptoms. DIAGNOSIS  Your caregiver will perform a physical exam to look for common symptoms of sciatica. He or she may ask you to do certain movements or activities that would trigger sciatic nerve pain. Other tests may be performed to find the cause of the sciatica. These may include:  Blood tests.  X-rays.  Imaging tests, such as an MRI or CT scan. TREATMENT  Treatment is directed at the cause of the sciatic pain. Sometimes, treatment is not necessary and the pain and discomfort goes away on its own. If treatment is needed, your caregiver may suggest:  Over-the-counter medicines to relieve pain.  Prescription medicines, such as anti-inflammatory medicine, muscle relaxants, or narcotics.  Applying heat or ice to the painful area.  Steroid injections to lessen pain, irritation, and inflammation around the nerve.  Reducing activity during periods of pain.  Exercising and stretching to strengthen your abdomen and improve flexibility of your spine. Your caregiver may suggest losing weight if the extra weight makes the back pain worse.  Physical therapy.  Surgery to eliminate what is pressing or  pinching the nerve, such as a bone spur or part of a herniated disk. HOME CARE INSTRUCTIONS   Only take over-the-counter or prescription medicines for pain or discomfort as directed by your caregiver.  Apply ice to the affected area for 20 minutes, 3-4 times a day for the first 48-72 hours. Then try heat in the same way.  Exercise, stretch, or perform your usual activities if these do not aggravate your pain.  Attend physical therapy sessions as directed by your caregiver.  Keep all follow-up  appointments as directed by your caregiver.  Do not wear high heels or shoes that do not provide proper support.  Check your mattress to see if it is too soft. A firm mattress may lessen your pain and discomfort. SEEK IMMEDIATE MEDICAL CARE IF:   You lose control of your bowel or bladder (incontinence).  You have increasing weakness in the lower back, pelvis, buttocks, or legs.  You have redness or swelling of your back.  You have a burning sensation when you urinate.  You have pain that gets worse when you lie down or awakens you at night.  Your pain is worse than you have experienced in the past.  Your pain is lasting longer than 4 weeks.  You are suddenly losing weight without reason. MAKE SURE YOU:  Understand these instructions.  Will watch your condition.  Will get help right away if you are not doing well or get worse.   This information is not intended to replace advice given to you by your health care provider. Make sure you discuss any questions you have with your health care provider.   Document Released: 05/06/2001 Document Revised: 01/31/2015 Document Reviewed: 09/21/2011 Elsevier Interactive Patient Education [employer].

## 2015-07-17 LAB — URINE CULTURE

## 2015-09-30 ENCOUNTER — Emergency Department
Admission: EM | Admit: 2015-09-30 | Discharge: 2015-09-30 | Disposition: A | Discharge status: Other Acute Inpatient Hospital | Payer: BLUE CROSS/BLUE SHIELD | Attending: Emergency Medicine | Admitting: Emergency Medicine

## 2015-09-30 ENCOUNTER — Emergency Department: Payer: BLUE CROSS/BLUE SHIELD

## 2015-09-30 ENCOUNTER — Encounter: Payer: Self-pay | Admitting: Emergency Medicine

## 2015-09-30 DIAGNOSIS — Z792 Long term (current) use of antibiotics: Secondary | ICD-10-CM | POA: Insufficient documentation

## 2015-09-30 DIAGNOSIS — Z79899 Other long term (current) drug therapy: Secondary | ICD-10-CM | POA: Diagnosis not present

## 2015-09-30 DIAGNOSIS — Z791 Long term (current) use of non-steroidal anti-inflammatories (NSAID): Secondary | ICD-10-CM | POA: Insufficient documentation

## 2015-09-30 DIAGNOSIS — J189 Pneumonia, unspecified organism: Secondary | ICD-10-CM | POA: Insufficient documentation

## 2015-09-30 DIAGNOSIS — A419 Sepsis, unspecified organism: Secondary | ICD-10-CM | POA: Insufficient documentation

## 2015-09-30 DIAGNOSIS — J860 Pyothorax with fistula: Secondary | ICD-10-CM

## 2015-09-30 DIAGNOSIS — C07 Malignant neoplasm of parotid gland: Secondary | ICD-10-CM

## 2015-09-30 DIAGNOSIS — I10 Essential (primary) hypertension: Secondary | ICD-10-CM | POA: Diagnosis not present

## 2015-09-30 DIAGNOSIS — C7801 Secondary malignant neoplasm of right lung: Secondary | ICD-10-CM | POA: Diagnosis not present

## 2015-09-30 DIAGNOSIS — R0602 Shortness of breath: Secondary | ICD-10-CM | POA: Diagnosis present

## 2015-09-30 LAB — CBC WITH DIFFERENTIAL/PLATELET
Basophils Absolute: 0 10*3/uL (ref 0–0.1)
Basophils Relative: 0 %
EOS ABS: 0 10*3/uL (ref 0–0.7)
Eosinophils Relative: 0 %
HEMATOCRIT: 30.4 % — AB (ref 35.0–47.0)
HEMOGLOBIN: 10 g/dL — AB (ref 12.0–16.0)
LYMPHS ABS: 1 10*3/uL (ref 1.0–3.6)
Lymphocytes Relative: 5 %
MCH: 26.9 pg (ref 26.0–34.0)
MCHC: 32.9 g/dL (ref 32.0–36.0)
MCV: 82 fL (ref 80.0–100.0)
MONO ABS: 1.3 10*3/uL — AB (ref 0.2–0.9)
Neutro Abs: 16.8 10*3/uL — ABNORMAL HIGH (ref 1.4–6.5)
Platelets: 452 10*3/uL — ABNORMAL HIGH (ref 150–440)
RBC: 3.7 MIL/uL — ABNORMAL LOW (ref 3.80–5.20)
RDW: 15.6 % — AB (ref 11.5–14.5)
WBC: 19.1 10*3/uL — ABNORMAL HIGH (ref 3.6–11.0)

## 2015-09-30 LAB — PROTIME-INR
INR: 1.13
PROTHROMBIN TIME: 14.7 s (ref 11.4–15.0)

## 2015-09-30 LAB — COMPREHENSIVE METABOLIC PANEL
ALK PHOS: 82 U/L (ref 38–126)
ALT: 23 U/L (ref 14–54)
ANION GAP: 12 (ref 5–15)
AST: 16 U/L (ref 15–41)
Albumin: 2.7 g/dL — ABNORMAL LOW (ref 3.5–5.0)
BUN: 15 mg/dL (ref 6–20)
CALCIUM: 9.5 mg/dL (ref 8.9–10.3)
CO2: 26 mmol/L (ref 22–32)
CREATININE: 0.56 mg/dL (ref 0.44–1.00)
Chloride: 94 mmol/L — ABNORMAL LOW (ref 101–111)
Glucose, Bld: 110 mg/dL — ABNORMAL HIGH (ref 65–99)
Potassium: 3.4 mmol/L — ABNORMAL LOW (ref 3.5–5.1)
Sodium: 132 mmol/L — ABNORMAL LOW (ref 135–145)
TOTAL PROTEIN: 7.7 g/dL (ref 6.5–8.1)
Total Bilirubin: 1 mg/dL (ref 0.3–1.2)

## 2015-09-30 LAB — APTT: aPTT: 29 seconds (ref 24–36)

## 2015-09-30 LAB — LACTIC ACID, PLASMA
LACTIC ACID, VENOUS: 1.4 mmol/L (ref 0.5–2.0)
Lactic Acid, Venous: 1.5 mmol/L (ref 0.5–2.0)

## 2015-09-30 LAB — TROPONIN I

## 2015-09-30 LAB — LIPASE, BLOOD: LIPASE: 24 U/L (ref 11–51)

## 2015-09-30 MED ORDER — SODIUM CHLORIDE 0.9 % IV BOLUS (SEPSIS)
1000.0000 mL | INTRAVENOUS | Status: AC
  Administered 2015-09-30: 1000 mL via INTRAVENOUS

## 2015-09-30 MED ORDER — SODIUM CHLORIDE 0.9 % IV BOLUS (SEPSIS)
250.0000 mL | Freq: Once | INTRAVENOUS | Status: AC
  Administered 2015-09-30: 250 mL via INTRAVENOUS

## 2015-09-30 MED ORDER — VANCOMYCIN HCL IN DEXTROSE 1-5 GM/200ML-% IV SOLN
1000.0000 mg | Freq: Once | INTRAVENOUS | Status: AC
  Administered 2015-09-30: 1000 mg via INTRAVENOUS
  Filled 2015-09-30: qty 200

## 2015-09-30 MED ORDER — IOPAMIDOL (ISOVUE-370) INJECTION 76%
75.0000 mL | Freq: Once | INTRAVENOUS | Status: AC | PRN
  Administered 2015-09-30: 75 mL via INTRAVENOUS

## 2015-09-30 MED ORDER — SODIUM CHLORIDE 0.9 % IV BOLUS (SEPSIS)
1000.0000 mL | Freq: Once | INTRAVENOUS | Status: AC
  Administered 2015-09-30: 1000 mL via INTRAVENOUS

## 2015-09-30 MED ORDER — PIPERACILLIN-TAZOBACTAM 3.375 G IVPB 30 MIN
3.3750 g | Freq: Once | INTRAVENOUS | Status: AC
  Administered 2015-09-30: 3.375 g via INTRAVENOUS
  Filled 2015-09-30: qty 50

## 2015-09-30 MED ORDER — SODIUM CHLORIDE 0.9 % IV BOLUS (SEPSIS): 1000.0000 mL | Freq: Once | INTRAVENOUS | Status: DC

## 2015-09-30 NOTE — ED Notes (Signed)
Pt presents to ED via EMS from home c/o shortness of breath and pain to mid to upper back. Hx lung cancer, in remission, last treatment in December. Per EMS pt O2 80% on RA. O2 sat 100% on 2L n/c.

## 2015-09-30 NOTE — Progress Notes (Signed)
Pharmacy Antibiotic Note  Theresa Mercado is a 65 y.o. female admitted on 09/30/2015 with sepsis.  Pharmacy has been consulted for vancomycin & piperacillin/tazobactam dosing.  Plan: Piperacillin/tazobactam 3.375 g IV q8h EI  Vancomycin 1000 mg IV q12h (stacked dose to begin 6 hours after initial dose in ED) Goal vancomycin trough is 15-20 mcg/mL Vancomycin trough ordered for 5/9 @ 0900  Kinetics: weight 73 kg, CrCl ~70 mL/min Ke: 0.063 Half-life: 11 hrs Vd: 51L Est Cmin: 18 mcg/mL  Height: 5\' 7"  (170.2 cm) Weight: 161 lb 12.8 oz (73.392 kg) IBW/kg (Calculated) : 61.6  Temp (24hrs), Avg:98 F (36.7 C), Min:98 F (36.7 C), Max:98 F (36.7 C)   Recent Labs Lab 09/30/15 1251 09/30/15 1338 09/30/15 1538  WBC 19.1*  --   --   CREATININE 0.56  --   --   LATICACIDVEN  --  1.5 1.4    Estimated Creatinine Clearance: 69.1 mL/min (by C-G formula based on Cr of 0.56).    No Known Allergies  Antimicrobials this admission: vancomycin 5/7 >>  Piperacillin/tazobactam 5/7 >>   Dose adjustments this admission:  Microbiology results: 5/7 BCx: Sent 5/7 UCx: Sent   Thank you for allowing pharmacy to be a part of this patient's care.  Lenis Noon, PharmD Clinical Pharmacist 09/30/2015 4:49 PM

## 2015-09-30 NOTE — ED Notes (Signed)
Report called to Pamala Hurry, RN at [facility] [phone])

## 2015-09-30 NOTE — ED Provider Notes (Signed)
[facility] Emergency Department Provider Note  ____________________________________________  Time seen: Approximately 12:40 PM  I have reviewed the triage vital signs and the nursing notes.   HISTORY  Chief Complaint Shortness of Breath    HPI Theresa Mercado is a 65 y.o. 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  Reportedly the shortness of breath started somewhat acutely last night.  She reports that it was severe when it started and then it was only mild overnight during the night.  However got worse again this morning with pain radiating from her chest into her back.  They called EMS and when EMS arrived they reported that she had an SPO2 of 80% on room air.  She is tachycardic in the 120s as well.  Her SPO2 increased to 100% on 2 L nasal cannula in the ED.  The patient reports that she has not been ill recently with no upper respiratory symptoms.  The onset of the shortness of breath Acutely Last Night and Nothing Specifically Has Made It Better or Worse during That Time.  She Also Describes a Sharp Stabbing Pain That Radiates through from Her Chest into the Middle and Upper Part of Her Back.  Overall she describes the symptoms as severe.  She is in no acute distress at this time and states she feels better after being on the oxygen.  For her lung cancer she has been treated with radiation but no chemotherapy.  She is currently not receiving radiation treatments and her last treatment has not been for about 5 months.  She denies fever/chills, abdominal pain, nausea, vomiting, diarrhea, dysuria.      Past Medical History  Diagnosis Date  . Hypertension   . Back pain, chronic   . Arthritis   . Lung cancer ([place])     bil    There are no active problems to display for this patient.   Past Surgical History    Procedure Laterality Date  . Salivary gland surgery      Current Outpatient Rx  Name  Route  Sig  Dispense  Refill  . cyclobenzaprine (FLEXERIL) 10 MG tablet   Oral   Take 1 tablet (10 mg total) by mouth 3 (three) times daily as needed for muscle spasms.   30 tablet   0   . diazepam (VALIUM) 2 MG tablet   Oral   Take 1 tablet (2 mg total) by mouth every 8 (eight) hours as needed for muscle spasms.   10 tablet   0   . etodolac (LODINE) 500 MG tablet   Oral   Take 1 tablet (500 mg total) by mouth 2 (two) times daily.   60 tablet   0   . HYDROcodone-acetaminophen (NORCO/VICODIN) 5-325 MG per tablet   Oral   Take 1 tablet by mouth every 6 (six) hours as needed for moderate pain or severe pain.   12 tablet   0   . lidocaine (LIDODERM) 5 %   Transdermal   Place 1 patch onto the skin daily. Remove & Discard patch within 12 hours or as directed by MD   10 patch   0   . predniSONE (DELTASONE) 20 MG tablet   Oral   Take 3 tablets (60 mg total) by mouth daily.   12 tablet   0   . sulfamethoxazole-trimethoprim (  BACTRIM DS,SEPTRA DS) 800-160 MG per tablet   Oral   Take 1 tablet by mouth 2 (two) times daily.   10 tablet   0   . tamsulosin (FLOMAX) 0.4 MG CAPS capsule   Oral   Take 1 capsule (0.4 mg total) by mouth daily.   30 capsule   0   . tiZANidine (ZANAFLEX) 4 MG tablet   Oral   Take 1 tablet (4 mg total) by mouth every 8 (eight) hours as needed for muscle spasms.   21 tablet   0   . traMADol (ULTRAM) 50 MG tablet   Oral   Take 1 tablet (50 mg total) by mouth every 6 (six) hours as needed.   20 tablet   0     Allergies Review of patient's allergies indicates no known allergies.  History reviewed. No pertinent family history.  Social History Social History  Substance Use Topics  . Smoking status: Never Smoker   . Smokeless tobacco: Never Used  . Alcohol Use: No    Review of Systems Constitutional: No fever/chills Eyes: No visual  changes. ENT: No sore throat. Cardiovascular: +chest pain radiating to back Respiratory: +shortness of breath. Gastrointestinal: No abdominal pain.  No nausea, no vomiting.  No diarrhea.  No constipation. Genitourinary: Negative for dysuria. Musculoskeletal: Negative for back pain. Skin: Negative for rash. Neurological: Negative for headaches, focal weakness or numbness.  10-point ROS otherwise negative.  ____________________________________________   PHYSICAL EXAM:  VITAL SIGNS: ED Triage Vitals  Enc Vitals Group     BP 09/30/15 1222 107/69 mmHg     Pulse Rate 09/30/15 1222 122     Resp 09/30/15 1222 16     Temp 09/30/15 1222 98 F (36.7 C)     Temp Source 09/30/15 1222 Oral     SpO2 09/30/15 1222 99 %     Weight 09/30/15 1222 161 lb 12.8 oz (73.392 kg)     Height 09/30/15 1222 5\' 7"  (1.702 m)     Head Cir --      Peak Flow --      Pain Score --      Pain Loc --      Pain Edu? --      Excl. in [place]? --     Constitutional: Alert and oriented.  Ill appearing but nontoxic. Eyes: Conjunctivae are normal. PERRL. EOMI. Head: Atraumatic. Nose: No congestion/rhinnorhea. Mouth/Throat: Mucous membranes are dry.  Oropharynx non-erythematous. Neck: No stridor.  No meningeal signs.   Cardiovascular: Tachycardia in the 120s, regular rhythm. Good peripheral circulation. Grossly normal heart sounds.   Respiratory: Normal respiratory effort.  No retractions. Lungs CTAB. Gastrointestinal: Soft and nontender. No distention.  Musculoskeletal: No lower extremity tenderness nor edema. No gross deformities of extremities. Neurologic:  Normal speech and language.  The patient has minimal movement of the left side of her face and mouth but the patient and family state that this is constant after a parotid surgery that she had previously. Skin:  Skin is warm, dry and intact. No rash noted. Psychiatric: Mood and affect are normal. Speech and behavior are  normal.  ____________________________________________   LABS (all labs ordered are listed, but only abnormal results are displayed)  Labs Reviewed  CBC WITH DIFFERENTIAL/PLATELET - Abnormal; Notable for the following:    WBC 19.1 (*)    RBC 3.70 (*)    Hemoglobin 10.0 (*)    HCT 30.4 (*)    RDW 15.6 (*)    Platelets 452 (*)  Neutro Abs 16.8 (*)    Monocytes Absolute 1.3 (*)    All other components within normal limits  COMPREHENSIVE METABOLIC PANEL - Abnormal; Notable for the following:    Sodium 132 (*)    Potassium 3.4 (*)    Chloride 94 (*)    Glucose, Bld 110 (*)    Albumin 2.7 (*)    All other components within normal limits  CULTURE, BLOOD (ROUTINE X 2)  CULTURE, BLOOD (ROUTINE X 2)  URINE CULTURE  TROPONIN I  LACTIC ACID, PLASMA  APTT  LIPASE, BLOOD  PROTIME-INR  LACTIC ACID, PLASMA  URINALYSIS COMPLETEWITH MICROSCOPIC ([facility] ONLY)   ____________________________________________  EKG  ED ECG REPORT I, Carlee Tesfaye, the attending physician, personally viewed and interpreted this ECG.  Date: 09/30/2015 EKG Time: 12:19 Rate: 120 Rhythm: Sinus tachycardia QRS Axis: normal Intervals: normal ST/T Wave abnormalities: normal Conduction Disturbances: none Narrative Interpretation: unremarkable  ____________________________________________  RADIOLOGY   Ct Angio Chest Pe W/cm &/or Wo Cm  09/30/2015  CLINICAL DATA:  Shortness of breath.  History of lung carcinoma EXAM: CT ANGIOGRAPHY CHEST WITH CONTRAST TECHNIQUE: Multidetector CT imaging of the chest was performed using the standard protocol during bolus administration of intravenous contrast. Multiplanar CT image reconstructions and MIPs were obtained to evaluate the vascular anatomy. CONTRAST:  75 mL Isovue 370 nonionic COMPARISON:  Chest CT January 08, 2014 and chest radiograph Sep 30, 2015 FINDINGS: Mediastinum/Lymph Nodes: There is no demonstrable pulmonary embolus. There is no thoracic aortic aneurysm or  dissection. The visualized great vessels appear normal. Pericardium is not thickened. Thyroid appears somewhat inhomogeneous without well-defined dominant mass. There is no appreciable thoracic adenopathy. There is thickening of the mid the distal esophagus. There is a complex collection of soft tissue material with air extending from the esophagus into the posterior right mediastinum and left lower lobe region, concerning for a fistula between the esophagus and adjacent mediastinum with likely extension into the right lower lobe medially. There is irregular consolidation in this area. Postoperative changes noted in this area. Lungs/Pleura: There is a focal right pleural effusion. There is patchy consolidation in the lateral segment of the right middle lobe as well as consolidation in the area concerning for fistula between the esophagus and adjacent mediastinum and medial right base regions. There is a nodular opacity in the anterior segment of the left upper lobe measuring 4 x 3 mm seen on axial slice 38 series 6. There is a 3 mm nodular opacity in the posterior segment of the left upper lobe seen on axial slice 39 series 6. There is a nodular opacity in the apical segment of the right upper lobe measuring 6 x 4 mm on axial slice 21 series 6. There is a 6 x 5 mm nodular opacity in the anterior segment of the right upper lobe seen on axial slice 32 series 6. There is a 3 mm nodular opacity in right upper lobe on axial slice 42 series 6. There is a nodular opacity in the anterior segment of the right upper lobe on axial slice 54 series 6 measuring 7 x 6 mm. There is a nodular opacity in the medial segment of the right middle lobe on axial slice 75 series 6 measuring 6 x 5 mm. There is a nodular opacity in the lingula on axial slice 92 series 6 measuring 1.0 x 0.8 cm. There is airspace consolidation in the posterior segment of the left upper lobe which has an appearance most consistent with pneumonia. Upper abdomen:  In the visualized upper abdomen, there is hepatic steatosis. There is a cystic lesion in the dome of the liver on the left measuring 1.3 x 1.0 cm. Visualized upper abdominal structures otherwise appear unremarkable. Musculoskeletal: There is a sclerotic lesion in the T8 vertebral body, concerning for a metastatic focus. No lytic or destructive bone lesions are evident. Review of the MIP images confirms the above findings. IMPRESSION: No demonstrable pulmonary embolus. Complex collection of air and soft tissue material which appears to track from the esophagus into the posterior mediastinum on the right and into the post rib medial right lung base region. Suspect developing abscess due to fistula between the esophagus and mediastinum/pleural surface on the right. There is nearby right lung base consolidation as well as loculated effusion in this area. There may be a degree of bronchopleural fistula in this area as well. This area may well warrant direct visualization to further assess. Airspace consolidation posterior segment left upper lobe, consistent with pneumonia. Fairly small but present right pleural effusion. Multiple nodular opacities in the lungs, felt to be consistent with metastatic foci. These lesions were not present on prior CT. No appreciable adenopathy evident. Sclerotic focus in the T8 vertebral body, felt to represent a bony metastasis. It was present on the prior CT suggesting quiescent metastasis. Critical Value/emergent results were called by telephone at the time of interpretation on 09/30/2015 at 2:50 pm to Dr. Hinda Kehr , who verbally acknowledged these results. Electronically Signed   By: Lowella Grip III M.D.   On: 09/30/2015 14:54   Dg Chest Portable 1 View  09/30/2015  CLINICAL DATA:  Shortness of breath, mid upper back pain, history lung cancer in remission, hypertension EXAM: PORTABLE CHEST 1 VIEW COMPARISON:  Portable exam 1307 hours compared 01/08/2014 FINDINGS: Heart size,  mediastinal contours and pulmonary vascularity. Volume loss RIGHT hemi thorax. Postsurgical changes RIGHT upper lobe. Subsegmental atelectasis LEFT upper lobe. Underlying emphysematous changes. No infiltrate, pleural effusion or pneumothorax. Bones demineralized. IMPRESSION: Postsurgical changes RIGHT lung. Underlying COPD with subsegmental atelectasis LEFT upper lobe. Electronically Signed   By: Lavonia Dana M.D.   On: 09/30/2015 14:22    ____________________________________________   PROCEDURES  Procedure(s) performed: None  Critical Care performed: Yes, see critical care note(s)  CRITICAL CARE Performed by: Hinda Kehr   Total critical care time: 60 minutes  Critical care time was exclusive of separately billable procedures and treating other patients.  Critical care was necessary to treat or prevent imminent or life-threatening deterioration.  Critical care was time spent personally by me on the following activities: development of treatment plan with patient and/or surrogate as well as nursing, discussions with consultants, evaluation of patient's response to treatment, examination of patient, obtaining history from patient or surrogate, ordering and performing treatments and interventions, ordering and review of laboratory studies, ordering and review of radiographic studies, pulse oximetry and re-evaluation of patient's condition.  ____________________________________________   INITIAL IMPRESSION / ASSESSMENT AND PLAN / ED COURSE  Pertinent labs & imaging results that were available during my care of the patient were reviewed by me and considered in my medical decision making (see chart for details).  About 1:00pm:  The primary abdomen my differential is pulmonary embolism given the acute onset, history of cancer, hypoxemia, and chest pain radiating to the back.  She is currently stable with normal blood pressure and satting in the upper 90s to 100% on 2 L nasal cannula.   There is nothing at this point that sounds infectious although  I will also check a lactate and standard blood work and a chest x-ray.  However as soon as her creatinine has been verified I will proceed with a CT angiogram of her chest to rule out pulmonary embolism.  I discussed all this with the patient and family and they understand and agree.  I am giving her 1 L of normal saline both because she does look dry and also for renal protection and to support her preload.  ----------------------------------------- 3:06 PM on 09/30/2015 -----------------------------------------  Emergent results called in by radiology and discussed by phone.  Patient has a fistula communicating from the esophagus into the mediastinum, in additional to multiple areas of developing pneumonia.  Ordered code sepsis including empiric antibiotics and 36mL/kg of fluids (Zosyn and Vanc) and discussed critical results with patient and family.  The severity of her multiple medical problems and the need for a higher level of care, and addition to the fact that all of her providers are at [facility], we all agree and it is the patient's request that she be transferred to [facility].  At 3:15 PM I spoke with Dr. Verne Carrow with the [facility] emergency Department.  He explained that they are on MedSurg diversion whenever possible due to massive overcrowding in the emergency department.  Since the patient is appropriate care up until this point, he would like me to speak with the admitting service directly to see if they will take the patient without going to the emergency department first given the overcrowding problems.  Given the patient's sepsis and potential for decline, I asked the transfer center if I could speak directly with the eICU as well as with the oncologist.  I am awaiting a call back as of 3:20pm.  Dr. Verne Carrow did say that he would accept the patient if they are not willing to do so directly, but it would be in everyone's best interest to go  directly to the unit.  ----------------------------------------- 3:51 PM on 09/30/2015 -----------------------------------------  I spoke by phone with Dr. Gareth Eagle, the medical ICU doctor, and he understand and agreed with my management thus far and agreed to accept the patient directly to the ICU.  He had no further medical management recommendations at this time.  I updated the patient and family and we will transfer by [facility] EMS as soon as a bed has been assigned. 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[facility])  Primary cancer of parotid gland ([facility])  Malignant neoplasm metastatic to right lung [facility])     MEDICATIONS GIVEN DURING THIS VISIT:  Medications  sodium chloride 0.9 % bolus 1,000 mL (1,000 mLs Intravenous New Bag/Given 09/30/15 1505)    And  sodium chloride 0.9 % bolus 250 mL (not administered)  vancomycin (VANCOCIN) IVPB 1000 mg/200 mL premix (not administered)  sodium chloride 0.9 % bolus 1,000 mL (0 mLs Intravenous Stopped 09/30/15 1540)  iopamidol (ISOVUE-370) 76 % injection 75 mL (75 mLs Intravenous Contrast Given 09/30/15 1352)  piperacillin-tazobactam (ZOSYN) IVPB 3.375 g (3.375 g Intravenous New Bag/Given 09/30/15 1505)     NEW OUTPATIENT MEDICATIONS STARTED DURING THIS VISIT:  New Prescriptions   No medications on file      Note:  This document was prepared using Dragon voice recognition software and may include unintentional dictation errors.   Hinda Kehr, MD 09/30/15 [phone]

## 2015-10-02 NOTE — ED Notes (Signed)
Blood cultures drawn @ 1500 prior to first dose of antibiotics started @ 1505.

## 2015-10-05 LAB — CULTURE, BLOOD (ROUTINE X 2)
Culture: NO GROWTH
Culture: NO GROWTH

## 2015-10-28 ENCOUNTER — Emergency Department: Payer: Medicare HMO

## 2015-10-28 ENCOUNTER — Encounter: Payer: Self-pay | Admitting: Emergency Medicine

## 2015-10-28 ENCOUNTER — Inpatient Hospital Stay
Admission: EM | Admit: 2015-10-28 | Discharge: 2015-11-01 | DRG: 872 | Disposition: A | Discharge status: Other Acute Inpatient Hospital | Payer: Medicare HMO | Attending: Internal Medicine | Admitting: Internal Medicine

## 2015-10-28 DIAGNOSIS — Z79899 Other long term (current) drug therapy: Secondary | ICD-10-CM | POA: Diagnosis not present

## 2015-10-28 DIAGNOSIS — N390 Urinary tract infection, site not specified: Secondary | ICD-10-CM | POA: Diagnosis present

## 2015-10-28 DIAGNOSIS — M1990 Unspecified osteoarthritis, unspecified site: Secondary | ICD-10-CM | POA: Diagnosis present

## 2015-10-28 DIAGNOSIS — M549 Dorsalgia, unspecified: Secondary | ICD-10-CM | POA: Insufficient documentation

## 2015-10-28 DIAGNOSIS — R0602 Shortness of breath: Secondary | ICD-10-CM

## 2015-10-28 DIAGNOSIS — Z8042 Family history of malignant neoplasm of prostate: Secondary | ICD-10-CM

## 2015-10-28 DIAGNOSIS — J90 Pleural effusion, not elsewhere classified: Secondary | ICD-10-CM | POA: Diagnosis present

## 2015-10-28 DIAGNOSIS — A419 Sepsis, unspecified organism: Secondary | ICD-10-CM | POA: Diagnosis present

## 2015-10-28 DIAGNOSIS — G8929 Other chronic pain: Secondary | ICD-10-CM | POA: Diagnosis present

## 2015-10-28 DIAGNOSIS — E876 Hypokalemia: Secondary | ICD-10-CM | POA: Diagnosis present

## 2015-10-28 DIAGNOSIS — I10 Essential (primary) hypertension: Secondary | ICD-10-CM | POA: Diagnosis present

## 2015-10-28 DIAGNOSIS — I4891 Unspecified atrial fibrillation: Secondary | ICD-10-CM | POA: Diagnosis present

## 2015-10-28 DIAGNOSIS — C07 Malignant neoplasm of parotid gland: Secondary | ICD-10-CM | POA: Diagnosis present

## 2015-10-28 DIAGNOSIS — C7801 Secondary malignant neoplasm of right lung: Secondary | ICD-10-CM | POA: Diagnosis present

## 2015-10-28 DIAGNOSIS — Z8249 Family history of ischemic heart disease and other diseases of the circulatory system: Secondary | ICD-10-CM | POA: Diagnosis not present

## 2015-10-28 DIAGNOSIS — D509 Iron deficiency anemia, unspecified: Secondary | ICD-10-CM | POA: Diagnosis present

## 2015-10-28 DIAGNOSIS — M546 Pain in thoracic spine: Secondary | ICD-10-CM | POA: Diagnosis not present

## 2015-10-28 DIAGNOSIS — R4182 Altered mental status, unspecified: Secondary | ICD-10-CM | POA: Diagnosis not present

## 2015-10-28 HISTORY — DX: Malignant neoplasm of parotid gland: C07

## 2015-10-28 LAB — COMPREHENSIVE METABOLIC PANEL
ALBUMIN: 1.9 g/dL — AB (ref 3.5–5.0)
ALK PHOS: 94 U/L (ref 38–126)
ALT: 22 U/L (ref 14–54)
AST: 35 U/L (ref 15–41)
Anion gap: 11 (ref 5–15)
BILIRUBIN TOTAL: 0.4 mg/dL (ref 0.3–1.2)
BUN: 17 mg/dL (ref 6–20)
CALCIUM: 8.4 mg/dL — AB (ref 8.9–10.3)
CO2: 30 mmol/L (ref 22–32)
Chloride: 94 mmol/L — ABNORMAL LOW (ref 101–111)
Creatinine, Ser: 0.55 mg/dL (ref 0.44–1.00)
GFR calc Af Amer: >60 mL/min (ref >60)
GLUCOSE: 122 mg/dL — AB (ref 65–99)
POTASSIUM: 3.3 mmol/L — AB (ref 3.5–5.1)
Sodium: 135 mmol/L (ref 135–145)
TOTAL PROTEIN: 6.2 g/dL — AB (ref 6.5–8.1)

## 2015-10-28 LAB — URINALYSIS COMPLETE WITH MICROSCOPIC (ARMC ONLY)
BACTERIA UA: NONE SEEN
BILIRUBIN URINE: NEGATIVE
Glucose, UA: NEGATIVE mg/dL
Nitrite: NEGATIVE
Protein, ur: 100 mg/dL — AB
Specific Gravity, Urine: 1.023 (ref 1.005–1.030)
pH: 6 (ref 5.0–8.0)

## 2015-10-28 LAB — CBC WITH DIFFERENTIAL/PLATELET
BASOS ABS: 0 10*3/uL (ref 0–0.1)
Basophils Relative: 0 %
Eosinophils Absolute: 0 10*3/uL (ref 0–0.7)
Eosinophils Relative: 0 %
HEMATOCRIT: 25.9 % — AB (ref 35.0–47.0)
HEMOGLOBIN: 8.4 g/dL — AB (ref 12.0–16.0)
Lymphs Abs: 0.4 10*3/uL — ABNORMAL LOW (ref 1.0–3.6)
MCH: 26.6 pg (ref 26.0–34.0)
MCHC: 32.3 g/dL (ref 32.0–36.0)
MCV: 82.4 fL (ref 80.0–100.0)
MONO ABS: 1 10*3/uL — AB (ref 0.2–0.9)
NEUTROS ABS: 16.6 10*3/uL — AB (ref 1.4–6.5)
Platelets: 335 10*3/uL (ref 150–440)
RBC: 3.14 MIL/uL — ABNORMAL LOW (ref 3.80–5.20)
RDW: 18.6 % — AB (ref 11.5–14.5)
WBC: 18 10*3/uL — ABNORMAL HIGH (ref 3.6–11.0)

## 2015-10-28 MED ORDER — KETAMINE HCL 10 MG/ML IJ SOLN: 1.0000 mg/kg | Freq: Once | INTRAMUSCULAR | Status: DC

## 2015-10-28 MED ORDER — HALOPERIDOL LACTATE 5 MG/ML IJ SOLN
5.0000 mg | Freq: Once | INTRAMUSCULAR | Status: AC
  Administered 2015-10-28: 5 mg via INTRAVENOUS
  Filled 2015-10-28: qty 1

## 2015-10-28 MED ORDER — KETAMINE HCL 10 MG/ML IJ SOLN
0.3000 mg/kg | Freq: Once | INTRAMUSCULAR | Status: AC
  Administered 2015-10-28: 20 mg via INTRAVENOUS
  Filled 2015-10-28: qty 2

## 2015-10-28 MED ORDER — KETOROLAC TROMETHAMINE 30 MG/ML IJ SOLN
15.0000 mg | Freq: Once | INTRAMUSCULAR | Status: AC
  Administered 2015-10-28: 15 mg via INTRAVENOUS
  Filled 2015-10-28: qty 1

## 2015-10-28 MED ORDER — SODIUM CHLORIDE 0.9 % IV BOLUS (SEPSIS)
1000.0000 mL | Freq: Once | INTRAVENOUS | Status: AC
  Administered 2015-10-28: 1000 mL via INTRAVENOUS

## 2015-10-28 MED ORDER — PIPERACILLIN-TAZOBACTAM 3.375 G IVPB 30 MIN
3.3750 g | Freq: Once | INTRAVENOUS | Status: AC
  Administered 2015-10-28: 3.375 g via INTRAVENOUS
  Filled 2015-10-28: qty 50

## 2015-10-28 MED ORDER — SODIUM CHLORIDE 0.9 % IV BOLUS (SEPSIS): 1000.0000 mL | Freq: Once | INTRAVENOUS | Status: DC

## 2015-10-28 MED ORDER — LIDOCAINE 5 % EX PTCH
1.0000 | MEDICATED_PATCH | CUTANEOUS | Status: DC
  Administered 2015-10-28: 1 via TRANSDERMAL
  Filled 2015-10-28: qty 1

## 2015-10-28 MED ORDER — ONDANSETRON HCL 4 MG/2ML IJ SOLN
4.0000 mg | Freq: Once | INTRAMUSCULAR | Status: AC
  Administered 2015-10-28: 4 mg via INTRAVENOUS
  Filled 2015-10-28: qty 2

## 2015-10-28 MED ORDER — HYDROMORPHONE HCL 1 MG/ML IJ SOLN
1.0000 mg | Freq: Once | INTRAMUSCULAR | Status: AC
  Administered 2015-10-28: 1 mg via INTRAVENOUS
  Filled 2015-10-28: qty 1

## 2015-10-28 MED ORDER — SODIUM CHLORIDE 0.9 % IV BOLUS (SEPSIS)
250.0000 mL | Freq: Once | INTRAVENOUS | Status: AC
  Administered 2015-10-29: 250 mL via INTRAVENOUS

## 2015-10-28 MED ORDER — LORAZEPAM 2 MG/ML IJ SOLN
1.0000 mg | Freq: Once | INTRAMUSCULAR | Status: AC
  Administered 2015-10-28: 1 mg via INTRAVENOUS
  Filled 2015-10-28: qty 1

## 2015-10-28 MED ORDER — HYDROMORPHONE HCL 1 MG/ML IJ SOLN
0.5000 mg | Freq: Once | INTRAMUSCULAR | Status: AC
  Administered 2015-10-28: 0.5 mg via INTRAVENOUS
  Filled 2015-10-28: qty 1

## 2015-10-28 MED ORDER — SODIUM CHLORIDE 0.9 % IV BOLUS (SEPSIS)
1000.0000 mL | Freq: Once | INTRAVENOUS | Status: AC
Start: 2015-10-28 — End: 2015-10-29
  Administered 2015-10-28: 1000 mL via INTRAVENOUS

## 2015-10-28 NOTE — ED Notes (Addendum)
Pt comes from home via EMS with c/o back pain.  Pt fell 8 years ago.  Pt has pain to lower back that causes the patient yell and thrash around the bed.  The pain got bad yesterday.  Pt has a hx of Lung CA. Pt states she took oxycodone this morning

## 2015-10-28 NOTE — H&P (Signed)
[facility] NAME: Neisa Fugate    MR#:  [id number]  DATE OF BIRTH:  [birth year]  DATE OF ADMISSION:  10/28/2015  PRIMARY CARE PHYSICIAN: [facility]   REQUESTING/REFERRING PHYSICIAN: Marcelene Butte, MD  CHIEF COMPLAINT:   Chief Complaint  Patient presents with  . Back Pain    HISTORY OF PRESENT ILLNESS:  Theresa Mercado  is a 65 y.o. female who presents with Altered mental status. Patient and family state that she was complaining of significant back pain today. However, she is confused here in the ED and even somewhat lethargic. She presented with tachycardia and tachypnea, and was found to have a leukocytosis and UTI. Hospitals were called for admission for sepsis due to urinary tract infection.  PAST MEDICAL HISTORY:   Past Medical History  Diagnosis Date  . Hypertension   . Back pain, chronic   . Arthritis   . 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 Salivary gland surgery      SOCIAL HISTORY:   Social History  Substance Use Topics  . Smoking status: Never Smoker   . Smokeless tobacco: Never Used  . Alcohol Use: No    FAMILY HISTORY:   Family History  Problem Relation Age of Onset  . Prostate cancer    . Hypertension      DRUG ALLERGIES:  No Known Allergies  MEDICATIONS AT HOME:   Prior to Admission medications   Medication Sig Start Date End Date Taking? Authorizing Provider  cyclobenzaprine (FLEXERIL) 5 MG tablet Take 5-10 mg by mouth 2 (two) times daily as needed for muscle spasms.   Yes Historical Provider, MD  gabapentin (NEURONTIN) 300 MG capsule Take 600 mg by mouth 3 (three) times daily.   Yes Historical Provider, MD  lisinopril (PRINIVIL,ZESTRIL) 5 MG tablet Take 5 mg by mouth daily.   Yes Historical Provider, MD  traMADol (ULTRAM) 50 MG tablet Take 50 mg by mouth 3 (three) times daily as  needed for moderate pain.  09/12/15  Yes Historical Provider, MD    REVIEW OF SYSTEMS:  Review of Systems  Unable to perform ROS: mental acuity     VITAL SIGNS:   Filed Vitals:   10/28/15 2200 10/28/15 2220 10/28/15 2225 10/28/15 2235  BP: 99/51 89/45 92/49  97/44  Pulse: 125 113 114   Temp:      TempSrc:      Resp: 19 17 18 19   Height:      Weight:      SpO2: 93% 95% 94%    Wt Readings from Last 3 Encounters:  10/28/15 67.223 kg (148 lb 3.2 oz)  09/30/15 73.392 kg (161 lb 12.8 oz)  07/14/15 79.833 kg (176 lb)    PHYSICAL EXAMINATION:  Physical Exam  Vitals reviewed. Constitutional: She appears well-developed and well-nourished. No distress.  HENT:  Head: Normocephalic and atraumatic.  Mouth/Throat: Oropharynx is clear and moist.  Eyes: Conjunctivae are normal. Pupils are equal, round, and reactive to light. No scleral icterus.  Neck: Normal range of motion. Neck supple. No JVD present. No thyromegaly present.  Cardiovascular: Normal rate, regular rhythm and intact distal pulses.  Exam reveals no gallop and no friction rub.   No murmur heard. Respiratory: Effort normal and breath sounds normal. No respiratory distress. She has no wheezes. She has no rales.  GI: Soft.  Bowel sounds are normal. She exhibits no distension. There is no tenderness.  Musculoskeletal: Normal range of motion. She exhibits no edema.  No arthritis, no gout  Lymphadenopathy:    She has no cervical adenopathy.  Neurological:  Unable to assess due to patient condition  Skin: Skin is warm and dry. No rash noted. No erythema.  Psychiatric:  Unable to assess due to patient condition    LABORATORY PANEL:   CBC  Recent Labs Lab 10/28/15 1931  WBC 18.0*  HGB 8.4*  HCT 25.9*  PLT 335   ------------------------------------------------------------------------------------------------------------------  Chemistries   Recent Labs Lab 10/28/15 1931  NA 135  K 3.3*  CL 94*  CO2 30  GLUCOSE  122*  BUN 17  CREATININE 0.55  CALCIUM 8.4*  AST 35  ALT 22  ALKPHOS 94  BILITOT 0.4   ------------------------------------------------------------------------------------------------------------------  Cardiac Enzymes No results for input(s): TROPONINI in the last 168 hours. ------------------------------------------------------------------------------------------------------------------  RADIOLOGY:  Dg Thoracic Spine 2 View  10/28/2015  CLINICAL DATA:  Dorsalgia.  History of lung carcinoma EXAM: THORACIC SPINE 3 VIEWS COMPARISON:  August 13, 2014 thoracic spine radiographs and chest CT with bony reformats Sep 30, 2015 FINDINGS: Frontal, lateral, and swimmer's views were obtained. There is no fracture or spondylolisthesis. A sclerotic focus in the T8 vertebral body is better appreciated by CT but is present on this study. There is mild disc space narrowing at multiple levels. No erosive change or paraspinous lesion. An esophageal stent is present. IMPRESSION: Sclerotic focus in the T8 vertebral body concerning for a sclerotic metastasis is present, better seen on recent CT. No new bone lesion evident. Mild disc space narrowing at several levels. No fracture or spondylolisthesis. Electronically Signed   By: Lowella Grip III M.D.   On: 10/28/2015 18:39   Dg Lumbar Spine 2-3 Views  10/28/2015  CLINICAL DATA:  Lumbago.  History of lung carcinoma EXAM: LUMBAR SPINE - 2-3 VIEW COMPARISON:  Lumbar MRI March 25, 2014; CT abdomen pelvis with bony reformats Oct 09, 2014 FINDINGS: Frontal, lateral, and spot lumbosacral lateral images were obtained. There are 5 non-rib-bearing lumbar type vertebral bodies. There is no fracture or spondylolisthesis. There is mild disc space narrowing at L5-S1. Other disc spaces appear normal. No blastic or lytic bone lesions identified. No erosive change. There are multiple calcified leiomyomas noted in the pelvis. IMPRESSION: Scoliosis. Mild osteoarthritic change at  L5-S1. No fracture or spondylolisthesis. No blastic or lytic lesions evident. Uterine leiomyomas, calcified, in the pelvis. Electronically Signed   By: Lowella Grip III M.D.   On: 10/28/2015 18:41   Ct Renal Stone Study  10/28/2015  CLINICAL DATA:  65 year old female with acute or chronic back pain. Patient has known lung cancer. EXAM: CT ABDOMEN AND PELVIS WITHOUT CONTRAST TECHNIQUE: Multidetector CT imaging of the abdomen and pelvis was performed following the standard protocol without IV contrast. COMPARISON:  CT dated 10/09/2014 FINDINGS: Evaluation of this exam is limited in the absence of intravenous contrast. There are postsurgical changes of the visualized portions of the right lung with areas of consolidation. There is a 2.8 x 3.6 cm paramediastinal collection at the right lung base. A pigtail drainage catheter extends from this collection into the esophagus and terminates within the stomach. There is partially visualized loculated appearing right-sided pleural effusion. There is a 5 mm nodule in the right lung base anteriorly. A 9 mm nodule in the left upper lobe/lingula has increased in size compared to the prior study (measured approximately 6 mm  on the CT dated 10/09/2014). No intra-abdominal free air or free fluid. There is a 1.3 cm hypodense lesion in the left lobe of the liver similar to prior study of which is not well characterized on this study but may represent a cyst or hemangioma. The gallbladder, pancreas appear unremarkable. Small scattered calcified splenic granuloma. The adrenal glands appear unremarkable. Multiple small nonobstructing bilateral renal calculi measuring up to 3 mm in the upper pole of the right kidney. There is no hydronephrosis on either side. The visualized ureters and the urinary bladder appear unremarkable. There multiple fibroids within the uterus cell which are partially calcified. An esophageal stent is partially visualized. A there is no evidence of bowel  obstruction or active inflammation. Normal appendix. The abdominal aorta and IVC are grossly unremarkable on this noncontrast study. No portal venous gas identified. There is no adenopathy. A 5 mm nodular density noted in the superficial subcutaneous soft tissues of the right anterior pelvic wall, nonspecific. There is a 1 cm nodular density in the subcutaneous soft tissues of the left anterior pelvic wall (series 2, image 70). The abdominal wall soft tissues appear unremarkable. There is stable sclerotic focus in the S3 vertebrae as well as a stable focus of sclerosis in the superior aspect of the T8 vertebra. This findings are nonspecific. IMPRESSION: Small nonobstructing bilateral renal calculi.  No hydronephrosis. Postsurgical changes of the right lung base with an area of consolidative/infiltrative change and partially visualized right pleural effusion. Focal area of collection at the right lung base. There is a drainage catheter extending from this collection into the stomach. Bilateral pulmonary nodules appear increased in size compared to prior study. Electronically Signed   By: Anner Crete M.D.   On: 10/28/2015 22:28    EKG:   Orders placed or performed during the hospital encounter of 09/30/15  . EKG 12-Lead  . EKG 12-Lead  . EKG 12-Lead  . EKG 12-Lead  . EKG 12-Lead  . EKG 12-Lead    IMPRESSION AND PLAN:  Principal Problem:   Sepsis ([place]) - broad spectrum IV antibiotics started in the ED, we'll continue on admission, lactic acid within normal limits, blood pressure soft, fluids running for blood pressure support. Cultures sent from ED. Active Problems:   UTI (lower urinary tract infection) - antibiotics and cultures as above   Cancer of parotid gland ([facility]) - patient had recurrence of this cancer after initial treatment and then metastasis to lungs. Unclear if she is currently being treated for this or not.   Chronic back pain - treat as needed   Arthritis - treat as needed  All  the records are reviewed and case discussed with ED provider. Management plans discussed with the patient and/or family.  DVT PROPHYLAXIS: SubQ lovenox  GI PROPHYLAXIS: None  ADMISSION STATUS: Inpatient  CODE STATUS: Full Code Status History    This patient does not have a recorded code status. Please follow your organizational policy for patients in this situation.      TOTAL TIME TAKING CARE OF THIS PATIENT: 45 minutes.    Kearstin Learn [place] 10/28/2015, 11:55 PM  [facility]  Office  [phone]  CC: Primary care physician; [place]

## 2015-10-28 NOTE — ED Notes (Signed)
EDP notified of patient's blood pressure being low.  EDP advised to hang another liter of fluid at this time.

## 2015-10-28 NOTE — ED Provider Notes (Signed)
Time Seen: Approximately 1830  I have reviewed the triage notes  Chief Complaint: Back Pain   History of Present Illness: Theresa Mercado is a 65 y.o. female who has a history of metastatic disease from parotid gland to the lungs. Patient's had a fistula with surgical management etc. The patient also has a history of chronic back pain. The patient is extremely poor historian and unable to give [place] any consistent current medical history and her only physical complaint is back pain. Pain does not seem to be exacerbated by movement. There is no history of fever. In discussion with her family ; they state that she's been complaining of the back pain "" for a while "". There's been no obvious focal weakness that is new, no obvious trauma. Patient states some occasional left lower quadrant abdominal pain. But otherwise offers no significant review of systems   Past Medical History  Diagnosis Date  . Hypertension   . Back pain, chronic   . Arthritis   . Cancer of parotid gland [facility])     metastatic to lung    Patient Active Problem List   Diagnosis Date Noted  . Sepsis ([place]) 10/28/2015  . UTI (lower urinary tract infection) 10/28/2015  . Cancer of parotid gland ([place]) 10/28/2015  . Arthritis 10/28/2015  . Chronic back pain 10/28/2015    Past Surgical History  Procedure Laterality Date  . Salivary gland surgery      Past Surgical History  Procedure Laterality Date  . Salivary gland surgery      Current Outpatient Rx  Name  Route  Sig  Dispense  Refill  . cyclobenzaprine (FLEXERIL) 5 MG tablet   Oral   Take 5-10 mg by mouth 2 (two) times daily as needed for muscle spasms.         Marland Kitchen gabapentin (NEURONTIN) 300 MG capsule   Oral   Take 600 mg by mouth 3 (three) times daily.         Marland Kitchen lisinopril (PRINIVIL,ZESTRIL) 5 MG tablet   Oral   Take 5 mg by mouth daily.         . traMADol (ULTRAM) 50 MG tablet   Oral   Take 50 mg by mouth 3 (three) times daily as needed for  moderate pain.       0     Allergies:  Review of patient's allergies indicates no known allergies.  Family History: Family History  Problem Relation Age of Onset  . Prostate cancer    . Hypertension      Social History: Social History  Substance Use Topics  . Smoking status: Never Smoker   . Smokeless tobacco: Never Used  . Alcohol Use: No     Review of Systems:   10 point review of systems was performed and was otherwise negative: Review of systems is mainly acquired from the medical record and through the family. Constitutional: No fever Eyes: No visual disturbances ENT: No sore throat, ear pain Cardiac: No chest pain Respiratory: No shortness of breath, wheezing, or stridor Abdomen: No abdominal pain, no vomiting, No diarrhea Endocrine: No weight loss, No night sweats Extremities: No peripheral edema, cyanosis Skin: No rashes, easy bruising Neurologic: No focal weakness, trouble with speech or swollowing Urologic: No dysuria, Hematuria, or urinary frequency   Physical Exam:  ED Triage Vitals  Enc Vitals Group     BP 10/28/15 1743 123/70 mmHg     Pulse Rate 10/28/15 1743 69     Resp  10/28/15 1743 20     Temp 10/28/15 1743 97.8 F (36.6 C)     Temp Source 10/28/15 1743 Oral     SpO2 10/28/15 1743 95 %     Weight 10/28/15 1743 148 lb 3.2 oz (67.223 kg)     Height 10/28/15 1743 5\' 4"  (1.626 m)     Head Cir --      Peak Flow --      Pain Score 10/28/15 1744 10     Pain Loc --      Pain Edu? --      Excl. in [place]? --     General: Awake , Alert , and Oriented times 3;Patient moving constantly in the stretcher moaning out that she is in pain but will stop moving and occasionally answer questions  Head: Normal cephalic , atraumatic Eyes: Pupils equal , round, reactive to light Nose/Throat: No nasal drainage, patent upper airway without erythema or exudate.  Neck: Supple, Full range of motion, No anterior adenopathy or palpable thyroid masses Lungs: Clear to  ascultation without wheezes , rhonchi, or rales Heart: Regular rate, regular rhythm without murmurs , gallops , or rubs Abdomen: Soft, non tender without rebound, guarding , or rigidity; bowel sounds positive and symmetric in all 4 quadrants. No organomegaly .        Extremities: 2 plus symmetric pulses. No edema, clubbing or cyanosis Neurologic: normal ambulation, Motor symmetric without deficits, sensory intact Skin: warm, dry, no rashes rectal exam with chaperone present shows a normal sphincter tone was dark stool in the rectal vault to guaiac negative. No palpable masses Labs:   All laboratory work was reviewed including any pertinent negatives or positives listed below:  Labs Reviewed  URINALYSIS COMPLETEWITH MICROSCOPIC ([facility] ONLY) - Abnormal; Notable for the following:    Color, Urine AMBER (*)    APPearance HAZY (*)    Ketones, ur 1+ (*)    Hgb urine dipstick 3+ (*)    Protein, ur 100 (*)    Leukocytes, UA 2+ (*)    Squamous Epithelial / LPF 0-5 (*)    All other components within normal limits  CBC WITH DIFFERENTIAL/PLATELET - Abnormal; Notable for the following:    WBC 18.0 (*)    RBC 3.14 (*)    Hemoglobin 8.4 (*)    HCT 25.9 (*)    RDW 18.6 (*)    Neutro Abs 16.6 (*)    Lymphs Abs 0.4 (*)    Monocytes Absolute 1.0 (*)    All other components within normal limits  COMPREHENSIVE METABOLIC PANEL - Abnormal; Notable for the following:    Potassium 3.3 (*)    Chloride 94 (*)    Glucose, Bld 122 (*)    Calcium 8.4 (*)    Total Protein 6.2 (*)    Albumin 1.9 (*)    All other components within normal limits  CULTURE, BLOOD (ROUTINE X 2)  CULTURE, BLOOD (ROUTINE X 2)  URINE CULTURE    Radiology  CT RENAL STONE STUDY (Final result) Result time: 10/28/15 [id number]   Final result by Rad Results In Interface (10/28/15 [id number])   Narrative:   CLINICAL DATA: 65 year old female with acute or chronic back pain. Patient has known lung cancer.  EXAM: CT ABDOMEN AND  PELVIS WITHOUT CONTRAST  TECHNIQUE: Multidetector CT imaging of the abdomen and pelvis was performed following the standard protocol without IV contrast.  COMPARISON: CT dated 10/09/2014  FINDINGS: Evaluation of this exam is limited in the absence of  intravenous contrast.  There are postsurgical changes of the visualized portions of the right lung with areas of consolidation. There is a 2.8 x 3.6 cm paramediastinal collection at the right lung base. A pigtail drainage catheter extends from this collection into the esophagus and terminates within the stomach. There is partially visualized loculated appearing right-sided pleural effusion. There is a 5 mm nodule in the right lung base anteriorly. A 9 mm nodule in the left upper lobe/lingula has increased in size compared to the prior study (measured approximately 6 mm on the CT dated 10/09/2014).  No intra-abdominal free air or free fluid.  There is a 1.3 cm hypodense lesion in the left lobe of the liver similar to prior study of which is not well characterized on this study but may represent a cyst or hemangioma. The gallbladder, pancreas appear unremarkable. Small scattered calcified splenic granuloma. The adrenal glands appear unremarkable.  Multiple small nonobstructing bilateral renal calculi measuring up to 3 mm in the upper pole of the right kidney. There is no hydronephrosis on either side. The visualized ureters and the urinary bladder appear unremarkable. There multiple fibroids within the uterus cell which are partially calcified.  An esophageal stent is partially visualized. A there is no evidence of bowel obstruction or active inflammation. Normal appendix.  The abdominal aorta and IVC are grossly unremarkable on this noncontrast study. No portal venous gas identified. There is no adenopathy. A 5 mm nodular density noted in the superficial subcutaneous soft tissues of the right anterior pelvic wall, nonspecific.  There is a 1 cm nodular density in the subcutaneous soft tissues of the left anterior pelvic wall (series 2, image 70). The abdominal wall soft tissues appear unremarkable. There is stable sclerotic focus in the S3 vertebrae as well as a stable focus of sclerosis in the superior aspect of the T8 vertebra. This findings are nonspecific.  IMPRESSION: Small nonobstructing bilateral renal calculi. No hydronephrosis.  Postsurgical changes of the right lung base with an area of consolidative/infiltrative change and partially visualized right pleural effusion. Focal area of collection at the right lung base. There is a drainage catheter extending from this collection into the stomach.  Bilateral pulmonary nodules appear increased in size compared to prior study.   Electronically Signed By: Anner Crete M.D. On: 10/28/2015 22:28          DG Thoracic Spine 2 View (Final result) Result time: 10/28/15 18:39:30   Final result by Rad Results In Interface (10/28/15 18:39:30)   Narrative:   CLINICAL DATA: Dorsalgia. History of lung carcinoma  EXAM: THORACIC SPINE 3 VIEWS  COMPARISON: August 13, 2014 thoracic spine radiographs and chest CT with bony reformats Sep 30, 2015  FINDINGS: Frontal, lateral, and swimmer's views were obtained. There is no fracture or spondylolisthesis. A sclerotic focus in the T8 vertebral body is better appreciated by CT but is present on this study. There is mild disc space narrowing at multiple levels. No erosive change or paraspinous lesion. An esophageal stent is present.  IMPRESSION: Sclerotic focus in the T8 vertebral body concerning for a sclerotic metastasis is present, better seen on recent CT. No new bone lesion evident. Mild disc space narrowing at several levels. No fracture or spondylolisthesis.   Electronically Signed By: Lowella Grip III M.D. On: 10/28/2015 18:39          DG Lumbar Spine 2-3 Views (Final  result) Result time: 10/28/15 18:41:42   Final result by Rad Results In Interface (10/28/15 18:41:42)   Narrative:  CLINICAL DATA: Lumbago. History of lung carcinoma  EXAM: LUMBAR SPINE - 2-3 VIEW  COMPARISON: Lumbar MRI March 25, 2014; CT abdomen pelvis with bony reformats Oct 09, 2014  FINDINGS: Frontal, lateral, and spot lumbosacral lateral images were obtained. There are 5 non-rib-bearing lumbar type vertebral bodies. There is no fracture or spondylolisthesis. There is mild disc space narrowing at L5-S1. Other disc spaces appear normal. No blastic or lytic bone lesions identified. No erosive change. There are multiple calcified leiomyomas noted in the pelvis.  IMPRESSION: Scoliosis. Mild osteoarthritic change at L5-S1. No fracture or spondylolisthesis. No blastic or lytic lesions evident. Uterine leiomyomas, calcified, in the pelvis.   Electronically Signed By: Lowella Grip III M.D. On: 10/28/2015 18:41             I personally reviewed the radiologic studies    Critical Care:  CRITICAL CARE Performed by: Daymon Larsen   Total critical care time: 43 minutes  Critical care time was exclusive of separately billable procedures and treating other patients.  Critical care was necessary to treat or prevent imminent or life-threatening deterioration.  Critical care was time spent personally by me on the following activities: development of treatment plan with patient and/or surrogate as well as nursing, discussions with consultants, evaluation of patient's response to treatment, examination of patient, obtaining history from patient or surrogate, ordering and performing treatments and interventions, ordering and review of laboratory studies, ordering and review of radiographic studies, pulse oximetry and re-evaluation of patient's condition.     ED Course: Patient was initially treated for her pain with nonnarcotic medications including her  chronic transdermal patch, etc. review of her records showed chronic pain in the back and I felt this was initially an exacerbation of her chronic discomfort. The patient's really not able to offer any history of significance or review of systems. According to the family this pain is been going on for quite some time. The patient had initial thoracic or lumbar spine x-rays performed to see if there was a sample of metastatic disease. These x-rays did not show any new obvious lytic lesions of significance. There is also no collapse of vertebral bodies, etc. Patient still had persistent pain and keeps moaning out and again won't offer [place] any other story other than that she's having back pain. Patient was tried on multiple pain medications and sedatives to try to get her comfortable. The patient  work up was then extended to laboratory work which showed that she had some anemia and an elevated white blood cell count. We then performed a rectal exam which was guaiac-negative along with an in and out urinalysis which showed some signs of hematuria and could be infection. Patient had extensive IV fluid bolus after she developed some hypotension toward the end of her stay here in emergency department. We were not sure whether or not this was indeed sepsis or whether or not she was getting hypotensive from all the medications that she had received. We assumed that` she was septic and was started on the sepsis criteria and will be initiated on Zosyn with pain and cultures pending. Due to the persistent nature of her pain I had spoken to the hospitalist team.   Assessment: * Acute unspecified thoracic and lumbar spine pain Possible sepsis Hematuria History of metastatic cancer  Final Clinical Impression:   Final diagnoses:  Back pain     Plan:  Inpatient management           Daymon Larsen, MD  10/28/15 2355 

## 2015-10-28 NOTE — ED Notes (Signed)
Patient transported to X-ray 

## 2015-10-29 ENCOUNTER — Inpatient Hospital Stay: Payer: Medicare HMO

## 2015-10-29 LAB — BASIC METABOLIC PANEL
ANION GAP: 7 (ref 5–15)
BUN: 12 mg/dL (ref 6–20)
CALCIUM: 7.6 mg/dL — AB (ref 8.9–10.3)
CHLORIDE: 103 mmol/L (ref 101–111)
CO2: 28 mmol/L (ref 22–32)
Creatinine, Ser: 0.39 mg/dL — ABNORMAL LOW (ref 0.44–1.00)
GFR calc non Af Amer: >60 mL/min (ref >60)
Glucose, Bld: 98 mg/dL (ref 65–99)
POTASSIUM: 2.9 mmol/L — AB (ref 3.5–5.1)
Sodium: 138 mmol/L (ref 135–145)

## 2015-10-29 LAB — BLOOD CULTURE ID PANEL (REFLEXED)
Acinetobacter baumannii: NOT DETECTED
CANDIDA ALBICANS: NOT DETECTED
CANDIDA KRUSEI: NOT DETECTED
CANDIDA PARAPSILOSIS: NOT DETECTED
CARBAPENEM RESISTANCE: NOT DETECTED
Candida glabrata: NOT DETECTED
Candida tropicalis: NOT DETECTED
ENTEROBACTERIACEAE SPECIES: NOT DETECTED
Enterobacter cloacae complex: NOT DETECTED
Enterococcus species: NOT DETECTED
Escherichia coli: NOT DETECTED
Haemophilus influenzae: NOT DETECTED
KLEBSIELLA OXYTOCA: NOT DETECTED
KLEBSIELLA PNEUMONIAE: NOT DETECTED
Listeria monocytogenes: NOT DETECTED
Methicillin resistance: NOT DETECTED
Neisseria meningitidis: NOT DETECTED
PROTEUS SPECIES: NOT DETECTED
PSEUDOMONAS AERUGINOSA: NOT DETECTED
STAPHYLOCOCCUS SPECIES: NOT DETECTED
STREPTOCOCCUS AGALACTIAE: NOT DETECTED
STREPTOCOCCUS PNEUMONIAE: NOT DETECTED
Serratia marcescens: NOT DETECTED
Staphylococcus aureus (BCID): NOT DETECTED
Streptococcus pyogenes: NOT DETECTED
Streptococcus species: DETECTED — AB
Vancomycin resistance: NOT DETECTED

## 2015-10-29 LAB — CBC
HEMATOCRIT: 24.4 % — AB (ref 35.0–47.0)
HEMOGLOBIN: 7.6 g/dL — AB (ref 12.0–16.0)
MCH: 26.4 pg (ref 26.0–34.0)
MCHC: 31.2 g/dL — ABNORMAL LOW (ref 32.0–36.0)
MCV: 84.8 fL (ref 80.0–100.0)
Platelets: 287 10*3/uL (ref 150–440)
RBC: 2.88 MIL/uL — AB (ref 3.80–5.20)
RDW: 18.8 % — ABNORMAL HIGH (ref 11.5–14.5)
WBC: 13.2 10*3/uL — ABNORMAL HIGH (ref 3.6–11.0)

## 2015-10-29 LAB — MRSA PCR SCREENING: MRSA by PCR: NEGATIVE

## 2015-10-29 LAB — FOLATE: FOLATE: 5.3 ng/mL — AB (ref >5.9)

## 2015-10-29 LAB — VITAMIN B12: Vitamin B-12: 575 pg/mL (ref 180–914)

## 2015-10-29 LAB — POTASSIUM: Potassium: 3.1 mmol/L — ABNORMAL LOW (ref 3.5–5.1)

## 2015-10-29 LAB — MAGNESIUM: MAGNESIUM: 1.5 mg/dL — AB (ref 1.7–2.4)

## 2015-10-29 LAB — FERRITIN: Ferritin: 877 ng/mL — ABNORMAL HIGH (ref 11–307)

## 2015-10-29 LAB — LACTIC ACID, PLASMA: LACTIC ACID, VENOUS: 1.8 mmol/L (ref 0.5–2.0)

## 2015-10-29 LAB — GLUCOSE, CAPILLARY: Glucose-Capillary: 113 mg/dL — ABNORMAL HIGH (ref 65–99)

## 2015-10-29 LAB — IRON AND TIBC: Iron: <5 ug/dL — ABNORMAL LOW (ref 28–170)

## 2015-10-29 MED ORDER — SODIUM CHLORIDE 0.9% FLUSH
3.0000 mL | Freq: Two times a day (BID) | INTRAVENOUS | Status: DC
  Administered 2015-10-29 – 2015-10-31 (×5): 3 mL via INTRAVENOUS

## 2015-10-29 MED ORDER — ACETAMINOPHEN 650 MG RE SUPP: 650.0000 mg | Freq: Four times a day (QID) | RECTAL | Status: DC | PRN

## 2015-10-29 MED ORDER — VANCOMYCIN HCL IN DEXTROSE 1-5 GM/200ML-% IV SOLN: 1000.0000 mg | Freq: Two times a day (BID) | INTRAVENOUS | Status: DC

## 2015-10-29 MED ORDER — HYDROMORPHONE HCL 1 MG/ML IJ SOLN
1.0000 mg | INTRAMUSCULAR | Status: DC | PRN
  Administered 2015-10-29 – 2015-11-01 (×15): 1 mg via INTRAVENOUS
  Filled 2015-10-29 (×15): qty 1

## 2015-10-29 MED ORDER — ENOXAPARIN SODIUM 40 MG/0.4ML ~~LOC~~ SOLN
40.0000 mg | SUBCUTANEOUS | Status: DC
  Administered 2015-10-29: 40 mg via SUBCUTANEOUS
  Filled 2015-10-29: qty 0.4

## 2015-10-29 MED ORDER — MAGNESIUM SULFATE 2 GM/50ML IV SOLN
2.0000 g | Freq: Once | INTRAVENOUS | Status: AC
  Administered 2015-10-29: 2 g via INTRAVENOUS
  Filled 2015-10-29: qty 50

## 2015-10-29 MED ORDER — POTASSIUM CHLORIDE 10 MEQ/100ML IV SOLN
10.0000 meq | INTRAVENOUS | Status: AC
  Administered 2015-10-29 – 2015-10-30 (×4): 10 meq via INTRAVENOUS
  Filled 2015-10-29 (×4): qty 100

## 2015-10-29 MED ORDER — ACETAMINOPHEN 325 MG PO TABS
650.0000 mg | ORAL_TABLET | Freq: Four times a day (QID) | ORAL | Status: DC | PRN
  Administered 2015-10-29 – 2015-10-31 (×2): 650 mg via ORAL
  Filled 2015-10-29 (×2): qty 2

## 2015-10-29 MED ORDER — ONDANSETRON HCL 4 MG/2ML IJ SOLN
4.0000 mg | Freq: Four times a day (QID) | INTRAMUSCULAR | Status: DC | PRN
  Administered 2015-10-29: 4 mg via INTRAVENOUS
  Filled 2015-10-29: qty 2

## 2015-10-29 MED ORDER — MORPHINE SULFATE (PF) 2 MG/ML IV SOLN
2.0000 mg | INTRAVENOUS | Status: DC | PRN
  Administered 2015-10-29 – 2015-10-31 (×6): 2 mg via INTRAVENOUS
  Filled 2015-10-29 (×6): qty 1

## 2015-10-29 MED ORDER — LORAZEPAM 2 MG/ML IJ SOLN
1.0000 mg | INTRAMUSCULAR | Status: DC | PRN
  Administered 2015-10-30: 1 mg via INTRAVENOUS
  Filled 2015-10-29: qty 1

## 2015-10-29 MED ORDER — METOPROLOL TARTRATE 5 MG/5ML IV SOLN
2.5000 mg | Freq: Three times a day (TID) | INTRAVENOUS | Status: DC
  Administered 2015-10-29 – 2015-11-01 (×9): 2.5 mg via INTRAVENOUS
  Filled 2015-10-29 (×9): qty 5

## 2015-10-29 MED ORDER — POTASSIUM CHLORIDE CRYS ER 20 MEQ PO TBCR
40.0000 meq | EXTENDED_RELEASE_TABLET | ORAL | Status: AC
  Administered 2015-10-29 (×2): 40 meq via ORAL
  Filled 2015-10-29 (×2): qty 2

## 2015-10-29 MED ORDER — OXYCODONE HCL 5 MG PO TABS
5.0000 mg | ORAL_TABLET | ORAL | Status: DC | PRN
  Administered 2015-10-29 – 2015-11-01 (×9): 5 mg via ORAL
  Filled 2015-10-29 (×9): qty 1

## 2015-10-29 MED ORDER — PIPERACILLIN-TAZOBACTAM 3.375 G IVPB
3.3750 g | Freq: Three times a day (TID) | INTRAVENOUS | Status: DC
  Administered 2015-10-29 – 2015-11-01 (×11): 3.375 g via INTRAVENOUS
  Filled 2015-10-29 (×15): qty 50

## 2015-10-29 MED ORDER — VANCOMYCIN HCL 10 G IV SOLR
1250.0000 mg | Freq: Once | INTRAVENOUS | Status: AC
  Administered 2015-10-29: 1250 mg via INTRAVENOUS
  Filled 2015-10-29: qty 1250

## 2015-10-29 MED ORDER — SODIUM CHLORIDE 0.9 % IV SOLN
INTRAVENOUS | Status: AC
  Administered 2015-10-29: via INTRAVENOUS

## 2015-10-29 MED ORDER — ONDANSETRON HCL 4 MG PO TABS: 4.0000 mg | ORAL_TABLET | Freq: Four times a day (QID) | ORAL | Status: DC | PRN

## 2015-10-29 MED ORDER — FENTANYL 12 MCG/HR TD PT72
12.5000 ug | MEDICATED_PATCH | TRANSDERMAL | Status: DC
  Administered 2015-10-29 – 2015-11-01 (×2): 12.5 ug via TRANSDERMAL
  Filled 2015-10-29 (×2): qty 1

## 2015-10-29 MED ORDER — VANCOMYCIN HCL 10 G IV SOLR
1250.0000 mg | INTRAVENOUS | Status: DC
  Filled 2015-10-29: qty 1250

## 2015-10-29 MED ORDER — SODIUM CHLORIDE 0.9 % IV SOLN
INTRAVENOUS | Status: DC
  Administered 2015-10-29 – 2015-10-31 (×3): via INTRAVENOUS

## 2015-10-29 NOTE — ED Notes (Signed)
Pt wakes to repeated verbal stimuli and to voice, but otherwise patient remains asleep.  Pt will follow simple tasks upon request, such as "keep your arm straight".

## 2015-10-29 NOTE — Progress Notes (Signed)
Pharmacy Antibiotic Note  Theresa Mercado is a 65 y.o. female admitted on 10/28/2015 with sepsis.  Pharmacy has been consulted for vancomycin and Zosyn dosing.  Plan: DW 67.2kg  Vd 47L kei 0.06 hr-1  T1/2 12 hours Vancomycin 1250 mg q 18 hours ordered with stacked dosing. Level before 5th dose. Goal 15-20.  Zosyn 3.375 grams q 8 hours ordered.   Height: 5\' 4"  (162.6 cm) Weight: 148 lb 3.2 oz (67.223 kg) IBW/kg (Calculated) : 54.7  Temp (24hrs), Avg:98 F (36.7 C), Min:97.8 F (36.6 C), Max:98.1 F (36.7 C)   Recent Labs Lab 10/28/15 1931  WBC 18.0*  CREATININE 0.55    Estimated Creatinine Clearance: 67 mL/min (by C-G formula based on Cr of 0.55).    No Known Allergies  Antimicrobials this admission: vancomycin  >>  Zosyn  >>   Dose adjustments this admission:   Microbiology results: 6/5 BCx: pending 6/5 UCx: pending  6/5 MRSA PCR: pending  6/5 CXR: R atelectasis vs. pneumonia 6/4 UA: LE(+) NO2(-) WBC 6-30   Thank you for allowing pharmacy to be a part of this patient's care.  Amaar Oshita S 10/29/2015 3:06 AM

## 2015-10-29 NOTE — Progress Notes (Signed)
Informed Dr. Posey Pronto about H/H and K. Pt running low grade temp. MD stated he will take care of it.

## 2015-10-29 NOTE — Progress Notes (Signed)
Pt reports this PM that her back pain is improved. Did not call out for pain medication this afternoon. Continue on IV ABX Zofran for UTI, Vanc was discontinued. No s/s adverse effects to ABX.

## 2015-10-29 NOTE — Care Management (Signed)
RNCM rounded on patient and she was awake. She does not feel well so I did not do a CM assessment. I introduced myself and RNCM role in her care. Palliative pending. Patient complains of pain. She has asked that her medical business not be shared with family. Cancer with mets. Clear diet starting today per progression rounds. RNCM to continue to follow for discharge needs.

## 2015-10-29 NOTE — Progress Notes (Addendum)
Pharmacy Consult for Electrolyte Monitoring Indication: Hypokalemia  No Known Allergies  Patient Measurements: Height: 5\' 4"  (162.6 cm) Weight: 148 lb 3.2 oz (67.223 kg) IBW/kg (Calculated) : 54.7  Vital Signs: Temp: 100.8 F (38.2 C) (06/05 1200) Temp Source: Oral (06/05 0250) BP: 124/69 mmHg (06/05 1200) Pulse Rate: 108 (06/05 1100) Intake/Output from previous day: 06/04 0701 - 06/05 0700 In: 1078 [I.V.:828; IV Piggyback:250] Out: 550 [Urine:550] Intake/Output from this shift: Total I/O In: 664 [I.V.:614; IV Piggyback:50] Out: -   Labs:  Recent Labs  10/28/15 1931 10/29/15 0732  WBC 18.0* 13.2*  HGB 8.4* 7.6*  HCT 25.9* 24.4*  PLT 335 287     Recent Labs  10/28/15 1931 10/29/15 0732  NA 135 138  K 3.3* 2.9*  CL 94* 103  CO2 30 28  GLUCOSE 122* 98  BUN 17 12  CREATININE 0.55 0.39*  CALCIUM 8.4* 7.6*  PROT 6.2*  --   ALBUMIN 1.9*  --   AST 35  --   ALT 22  --   ALKPHOS 94  --   BILITOT 0.4  --    Estimated Creatinine Clearance: 67 mL/min (by C-G formula based on Cr of 0.39).    Recent Labs  10/29/15 0241  GLUCAP 113*    Medical History: Past Medical History  Diagnosis Date  . Hypertension   . Back pain, chronic   . Arthritis   . 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 enoxaparin (LOVENOX) injection  40 mg Subcutaneous Q24H  . fentaNYL  12.5 mcg Transdermal Q72H  . metoprolol  2.5 mg Intravenous Q8H  . piperacillin-tazobactam (ZOSYN)  IV  3.375 g Intravenous Q8H  . potassium chloride  40 mEq Oral Q4H  . sodium chloride  1,000 mL Intravenous Once   And  . sodium chloride  1,000 mL Intravenous Once  . sodium chloride flush  3 mL Intravenous Q12H   Infusions:  . sodium chloride 65 mL/hr at 10/29/15 1149    Assessment: Pharmacy consulted to assist in managing electrolytes in this 65 y/o F with sepsis/UTI.   Plan:  Potassium replaced with 40 meq po x 2. Magnesium pending. Will f/u  magnesium and replace if necessary. Will recheck potassium at 1800.   Ulice Dash D 10/29/2015,1:37 PM  Addendum:  Mag= 1.5. Will replace with magnesium sulfate 2 g iv once.

## 2015-10-29 NOTE — Progress Notes (Signed)
[facility]                                                                                                                                                                                            Patient Demographics   Theresa Mercado, is a 65 y.o. female, DOB - [birth year], [id number]  Admit date - 10/28/2015   Admitting Physician Lance Coon, MD  Outpatient Primary MD for the patient is [place]   LOS - 1  Subjective:Patient admitted with fever and back pain. Back pain is improved. Denies any chest pains complains of back pain in the lumbar region.   Review of Systems:   CONSTITUTIONAL: Positive fever. No fatigue, weakness. No weight gain, no weight loss.  EYES: No blurry or double vision.  ENT: No tinnitus. No postnasal drip. No redness of the oropharynx.  RESPIRATORY: No cough, no wheeze, no hemoptysis. No dyspnea.  CARDIOVASCULAR: No chest pain. No orthopnea. No palpitations. No syncope.  GASTROINTESTINAL: No nausea, no vomiting or diarrhea. No abdominal pain. No melena or hematochezia.  GENITOURINARY: No dysuria or hematuria.  ENDOCRINE: No polyuria or nocturia. No heat or cold intolerance.  HEMATOLOGY: No anemia. No bruising. No bleeding.  INTEGUMENTARY: No rashes. No lesions.  MUSCULOSKELETAL: No arthritis. No swelling. No gout. Positive back pain NEUROLOGIC: No numbness, tingling, or ataxia. No seizure-type activity.  PSYCHIATRIC: No anxiety. No insomnia. No ADD.    Vitals:   Filed Vitals:   10/29/15 1000 10/29/15 1100 10/29/15 1200 10/29/15 1300  BP: 110/54 109/47 124/69 119/53  Pulse: 109 108    Temp: 100.4 F (38 C) 100.8 F (38.2 C) 100.8 F (38.2 C) 100.6 F (38.1 C)  TempSrc:      Resp: 15 19 15 24   Height:      Weight:      SpO2: 97% 97% 99%     Wt Readings from Last 3 Encounters:  10/28/15 67.223 kg (148 lb 3.2 oz)  09/30/15 73.392 kg (161 lb 12.8 oz)  07/14/15 79.833 kg (176 lb)      Intake/Output Summary (Last 24 hours) at 10/29/15 1348 Last data filed at 10/29/15 1300  Gross per 24 hour  Intake   1807 ml  Output    550 ml  Net   1257 ml    Physical Exam:   GENERAL: Pleasant-appearing in no apparent distress.  HEAD, EYES, EARS, NOSE AND THROAT: Atraumatic, normocephalic. Extraocular muscles are intact. Pupils equal and reactive to light. Sclerae anicteric. No conjunctival injection. No oro-pharyngeal erythema.  NECK: Supple. There is no jugular venous distention. No bruits, no  lymphadenopathy, no thyromegaly.  HEART: Regular rate and rhythm,. No murmurs, no rubs, no clicks.  LUNGS: Clear to auscultation bilaterally. No rales or rhonchi. No wheezes.  ABDOMEN: Soft, flat, nontender, nondistended. Has good bowel sounds. No hepatosplenomegaly appreciated.  EXTREMITIES: No evidence of any cyanosis, clubbing, or peripheral edema.  +2 pedal and radial pulses bilaterally.  NEUROLOGIC: The patient is alert, awake, and oriented x3 with no focal motor or sensory deficits appreciated bilaterally.  SKIN: Moist and warm with no rashes appreciated.  Psych: Not anxious, depressed LN: No inguinal LN enlargement    Antibiotics   Anti-infectives    Start     Dose/Rate Route Frequency Ordered Stop   10/29/15 1500  vancomycin (VANCOCIN) 1,250 mg in sodium chloride 0.9 % 250 mL IVPB  Status:  Discontinued     1,250 mg 166.7 mL/hr over 90 Minutes Intravenous Every 18 hours 10/29/15 0301 10/29/15 1048   10/29/15 0400  vancomycin (VANCOCIN) 1,250 mg in sodium chloride 0.9 % 250 mL IVPB     1,250 mg 166.7 mL/hr over 90 Minutes Intravenous  Once 10/29/15 0301 10/29/15 0515   10/29/15 0248  vancomycin (VANCOCIN) IVPB 1000 mg/200 mL premix  Status:  Discontinued     1,000 mg 200 mL/hr over 60 Minutes Intravenous Every 12 hours 10/29/15 0248 10/29/15 0301   10/29/15 0248  piperacillin-tazobactam (ZOSYN) IVPB 3.375 g     3.375 g 12.5 mL/hr over 240 Minutes Intravenous Every 8  hours 10/29/15 0248     10/28/15 2330  piperacillin-tazobactam (ZOSYN) IVPB 3.375 g     3.375 g 100 mL/hr over 30 Minutes Intravenous  Once 10/28/15 2318 10/29/15 0034      Medications   Scheduled Meds: . enoxaparin (LOVENOX) injection  40 mg Subcutaneous Q24H  . fentaNYL  12.5 mcg Transdermal Q72H  . metoprolol  2.5 mg Intravenous Q8H  . piperacillin-tazobactam (ZOSYN)  IV  3.375 g Intravenous Q8H  . potassium chloride  40 mEq Oral Q4H  . sodium chloride  1,000 mL Intravenous Once   And  . sodium chloride  1,000 mL Intravenous Once  . sodium chloride flush  3 mL Intravenous Q12H   Continuous Infusions: . sodium chloride 65 mL/hr at 10/29/15 1149   PRN Meds:.acetaminophen **OR** acetaminophen, HYDROmorphone (DILAUDID) injection, LORazepam, morphine injection, ondansetron **OR** ondansetron (ZOFRAN) IV, oxyCODONE   Data Review:   Micro Results Recent Results (from the past 240 hour(s))  Blood Culture (routine x 2)     Status: None (Preliminary result)   Collection Time: 10/29/15 12:00 AM  Result Value Ref Range Status   Specimen Description BLOOD LEFT ASSIST CONTROL  Final   Special Requests BOTTLES DRAWN AEROBIC AND ANAEROBIC [place]  Final   Culture NO GROWTH < 12 HOURS  Final   Report Status PENDING  Incomplete  Blood Culture (routine x 2)     Status: None (Preliminary result)   Collection Time: 10/29/15 12:00 AM  Result Value Ref Range Status   Specimen Description BLOOD LEFT ASSIST CONTROL  Final   Special Requests BOTTLES DRAWN AEROBIC AND ANAEROBIC [place]  Final   Culture NO GROWTH < 12 HOURS  Final   Report Status PENDING  Incomplete  MRSA PCR Screening     Status: None   Collection Time: 10/29/15  2:46 AM  Result Value Ref Range Status   MRSA by PCR NEGATIVE NEGATIVE Final    Comment:        The GeneXpert MRSA Assay (FDA approved for NASAL specimens only),  is one component of a comprehensive MRSA colonization surveillance program. It is  not intended to diagnose MRSA infection nor to guide or monitor treatment for MRSA infections.     Radiology Reports Dg Chest 1 View  10/29/2015  CLINICAL DATA:  Sepsis, metastatic disease to the right lung EXAM: CHEST 1 VIEW COMPARISON:  Portable chest x-ray of October 28, 2015 FINDINGS: There remains volume loss on the right with large pleural effusion. The left lung is well-expanded and exhibits stable linear density in its midportion new since 2015 but not greatly changed since Sep 30, 2015. The left heart border is normal. The right heart border is obscured. The mediastinum is mildly widened though stable. The bony thorax exhibits no acute abnormality. IMPRESSION: Slight interval increase in the volume of pleural fluid on the right. Underlying atelectasis or consolidation of the right middle and lower lobes is suspected. No pulmonary edema. Stable atelectasis or scarring in the left mid lung. Electronically Signed   By: David  [place] M.D.   On: 10/29/2015 07:28   Dg Thoracic Spine 2 View  10/28/2015  CLINICAL DATA:  Dorsalgia.  History of lung carcinoma EXAM: THORACIC SPINE 3 VIEWS COMPARISON:  August 13, 2014 thoracic spine radiographs and chest CT with bony reformats Sep 30, 2015 FINDINGS: Frontal, lateral, and swimmer's views were obtained. There is no fracture or spondylolisthesis. A sclerotic focus in the T8 vertebral body is better appreciated by CT but is present on this study. There is mild disc space narrowing at multiple levels. No erosive change or paraspinous lesion. An esophageal stent is present. IMPRESSION: Sclerotic focus in the T8 vertebral body concerning for a sclerotic metastasis is present, better seen on recent CT. No new bone lesion evident. Mild disc space narrowing at several levels. No fracture or spondylolisthesis. Electronically Signed   By: Lowella Grip III M.D.   On: 10/28/2015 18:39   Dg Lumbar Spine 2-3 Views  10/28/2015  CLINICAL DATA:  Lumbago.  History of lung  carcinoma EXAM: LUMBAR SPINE - 2-3 VIEW COMPARISON:  Lumbar MRI March 25, 2014; CT abdomen pelvis with bony reformats Oct 09, 2014 FINDINGS: Frontal, lateral, and spot lumbosacral lateral images were obtained. There are 5 non-rib-bearing lumbar type vertebral bodies. There is no fracture or spondylolisthesis. There is mild disc space narrowing at L5-S1. Other disc spaces appear normal. No blastic or lytic bone lesions identified. No erosive change. There are multiple calcified leiomyomas noted in the pelvis. IMPRESSION: Scoliosis. Mild osteoarthritic change at L5-S1. No fracture or spondylolisthesis. No blastic or lytic lesions evident. Uterine leiomyomas, calcified, in the pelvis. Electronically Signed   By: Lowella Grip III M.D.   On: 10/28/2015 18:41   Ct Angio Chest Pe W/cm &/or Wo Cm  09/30/2015  CLINICAL DATA:  Shortness of breath.  History of lung carcinoma EXAM: CT ANGIOGRAPHY CHEST WITH CONTRAST TECHNIQUE: Multidetector CT imaging of the chest was performed using the standard protocol during bolus administration of intravenous contrast. Multiplanar CT image reconstructions and MIPs were obtained to evaluate the vascular anatomy. CONTRAST:  75 mL Isovue 370 nonionic COMPARISON:  Chest CT January 08, 2014 and chest radiograph Sep 30, 2015 FINDINGS: Mediastinum/Lymph Nodes: There is no demonstrable pulmonary embolus. There is no thoracic aortic aneurysm or dissection. The visualized great vessels appear normal. Pericardium is not thickened. Thyroid appears somewhat inhomogeneous without well-defined dominant mass. There is no appreciable thoracic adenopathy. There is thickening of the mid the distal esophagus. There is a complex collection of soft tissue material  with air extending from the esophagus into the posterior right mediastinum and left lower lobe region, concerning for a fistula between the esophagus and adjacent mediastinum with likely extension into the right lower lobe medially. There is  irregular consolidation in this area. Postoperative changes noted in this area. Lungs/Pleura: There is a focal right pleural effusion. There is patchy consolidation in the lateral segment of the right middle lobe as well as consolidation in the area concerning for fistula between the esophagus and adjacent mediastinum and medial right base regions. There is a nodular opacity in the anterior segment of the left upper lobe measuring 4 x 3 mm seen on axial slice 38 series 6. There is a 3 mm nodular opacity in the posterior segment of the left upper lobe seen on axial slice 39 series 6. There is a nodular opacity in the apical segment of the right upper lobe measuring 6 x 4 mm on axial slice 21 series 6. There is a 6 x 5 mm nodular opacity in the anterior segment of the right upper lobe seen on axial slice 32 series 6. There is a 3 mm nodular opacity in right upper lobe on axial slice 42 series 6. There is a nodular opacity in the anterior segment of the right upper lobe on axial slice 54 series 6 measuring 7 x 6 mm. There is a nodular opacity in the medial segment of the right middle lobe on axial slice 75 series 6 measuring 6 x 5 mm. There is a nodular opacity in the lingula on axial slice 92 series 6 measuring 1.0 x 0.8 cm. There is airspace consolidation in the posterior segment of the left upper lobe which has an appearance most consistent with pneumonia. Upper abdomen: In the visualized upper abdomen, there is hepatic steatosis. There is a cystic lesion in the dome of the liver on the left measuring 1.3 x 1.0 cm. Visualized upper abdominal structures otherwise appear unremarkable. Musculoskeletal: There is a sclerotic lesion in the T8 vertebral body, concerning for a metastatic focus. No lytic or destructive bone lesions are evident. Review of the MIP images confirms the above findings. IMPRESSION: No demonstrable pulmonary embolus. Complex collection of air and soft tissue material which appears to track from the  esophagus into the posterior mediastinum on the right and into the post rib medial right lung base region. Suspect developing abscess due to fistula between the esophagus and mediastinum/pleural surface on the right. There is nearby right lung base consolidation as well as loculated effusion in this area. There may be a degree of bronchopleural fistula in this area as well. This area may well warrant direct visualization to further assess. Airspace consolidation posterior segment left upper lobe, consistent with pneumonia. Fairly small but present right pleural effusion. Multiple nodular opacities in the lungs, felt to be consistent with metastatic foci. These lesions were not present on prior CT. No appreciable adenopathy evident. Sclerotic focus in the T8 vertebral body, felt to represent a bony metastasis. It was present on the prior CT suggesting quiescent metastasis. Critical Value/emergent results were called by telephone at the time of interpretation on 09/30/2015 at 2:50 pm to Dr. Hinda Kehr , who verbally acknowledged these results. Electronically Signed   By: Lowella Grip III M.D.   On: 09/30/2015 14:54   Dg Chest Port 1 View  10/29/2015  CLINICAL DATA:  Sepsis EXAM: PORTABLE CHEST 1 VIEW COMPARISON:  09/30/2015 FINDINGS: Moderate to large right pleural effusion that is complex, non layering. There  is underlying atelectasis and/or pneumonia. Linear opacity in the left mid lung is chronic and likely scar. Esophageal metallic stent and neighboring plastic stents which by abdominal CT extend into a right paraesophageal collection. No pneumothorax or edema. Nonspecific mediastinal widening, accentuated by rotation. Lung sutures on the right. IMPRESSION: Large complex right pleural effusion with underlying atelectasis or pneumonia, new from exam 1 month prior but similar findings reported on chest CT from [facility] 10/07/2015. 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 Electronically Signed   By: Monte Fantasia M.D.   On: 10/29/2015 00:08   Dg Chest Portable 1 View  09/30/2015  CLINICAL DATA:  Shortness of breath, mid upper back pain, history lung cancer in remission, hypertension EXAM: PORTABLE CHEST 1 VIEW COMPARISON:  Portable exam 1307 hours compared 01/08/2014 FINDINGS: Heart size, mediastinal contours and pulmonary vascularity. Volume loss RIGHT hemi thorax. Postsurgical changes RIGHT upper lobe. Subsegmental atelectasis LEFT upper lobe. Underlying emphysematous changes. No infiltrate, pleural effusion or pneumothorax. Bones demineralized. IMPRESSION: Postsurgical changes RIGHT lung. Underlying COPD with subsegmental atelectasis LEFT upper lobe. Electronically Signed   By: Lavonia Dana M.D.   On: 09/30/2015 14:22   Ct Renal Stone Study  10/28/2015  CLINICAL DATA:  65 year old female with acute or chronic back pain. Patient has known lung cancer. EXAM: CT ABDOMEN AND PELVIS WITHOUT CONTRAST TECHNIQUE: Multidetector CT imaging of the abdomen and pelvis was performed following the standard protocol without IV contrast. COMPARISON:  CT dated 10/09/2014 FINDINGS: Evaluation of this exam is limited in the absence of intravenous contrast. There are postsurgical changes of the visualized portions of the right lung with areas of consolidation. There is a 2.8 x 3.6 cm paramediastinal collection at the right lung base. A pigtail drainage catheter extends from this collection into the esophagus and terminates within the stomach. There is partially visualized loculated appearing right-sided pleural effusion. There is a 5 mm nodule in the right lung base anteriorly. A 9 mm nodule in the left upper lobe/lingula has increased in size compared to the prior study (measured approximately 6 mm on the CT dated 10/09/2014). No intra-abdominal free air or free fluid. There is a 1.3 cm hypodense lesion in the left lobe of the liver similar to prior study of which is not well  characterized on this study but may represent a cyst or hemangioma. The gallbladder, pancreas appear unremarkable. Small scattered calcified splenic granuloma. The adrenal glands appear unremarkable. Multiple small nonobstructing bilateral renal calculi measuring up to 3 mm in the upper pole of the right kidney. There is no hydronephrosis on either side. The visualized ureters and the urinary bladder appear unremarkable. There multiple fibroids within the uterus cell which are partially calcified. An esophageal stent is partially visualized. A there is no evidence of bowel obstruction or active inflammation. Normal appendix. The abdominal aorta and IVC are grossly unremarkable on this noncontrast study. No portal venous gas identified. There is no adenopathy. A 5 mm nodular density noted in the superficial subcutaneous soft tissues of the right anterior pelvic wall, nonspecific. There is a 1 cm nodular density in the subcutaneous soft tissues of the left anterior pelvic wall (series 2, image 70). The abdominal wall soft tissues appear unremarkable. There is stable sclerotic focus in the S3 vertebrae as well as a stable focus of sclerosis in the superior aspect of the T8 vertebra. This findings are nonspecific. IMPRESSION: Small nonobstructing bilateral renal calculi.  No hydronephrosis. Postsurgical changes of the right lung base with  an area of consolidative/infiltrative change and partially visualized right pleural effusion. Focal area of collection at the right lung base. There is a drainage catheter extending from this collection into the stomach. Bilateral pulmonary nodules appear increased in size compared to prior study. Electronically Signed   By: Anner Crete M.D.   On: 10/28/2015 22:28     CBC  Recent Labs Lab 10/28/15 1931 10/29/15 0732  WBC 18.0* 13.2*  HGB 8.4* 7.6*  HCT 25.9* 24.4*  PLT 335 287  MCV 82.4 84.8  MCH 26.6 26.4  MCHC 32.3 31.2*  RDW 18.6* 18.8*  LYMPHSABS 0.4*  --    MONOABS 1.0*  --   EOSABS 0.0  --   BASOSABS 0.0  --     Chemistries   Recent Labs Lab 10/28/15 1931 10/29/15 0732  NA 135 138  K 3.3* 2.9*  CL 94* 103  CO2 30 28  GLUCOSE 122* 98  BUN 17 12  CREATININE 0.55 0.39*  CALCIUM 8.4* 7.6*  AST 35  --   ALT 22  --   ALKPHOS 94  --   BILITOT 0.4  --    ------------------------------------------------------------------------------------------------------------------ estimated creatinine clearance is 67 mL/min (by C-G formula based on Cr of 0.39). ------------------------------------------------------------------------------------------------------------------ No results for input(s): HGBA1C in the last 72 hours. ------------------------------------------------------------------------------------------------------------------ No results for input(s): CHOL, HDL, LDLCALC, TRIG, CHOLHDL, LDLDIRECT in the last 72 hours. ------------------------------------------------------------------------------------------------------------------ No results for input(s): TSH, T4TOTAL, T3FREE, THYROIDAB in the last 72 hours.  Invalid input(s): FREET3 ------------------------------------------------------------------------------------------------------------------ No results for input(s): VITAMINB12, FOLATE, FERRITIN, TIBC, IRON, RETICCTPCT in the last 72 hours.  Coagulation profile No results for input(s): INR, PROTIME in the last 168 hours.  No results for input(s): DDIMER in the last 72 hours.  Cardiac Enzymes No results for input(s): CKMB, TROPONINI, MYOGLOBIN in the last 168 hours.  Invalid input(s): CK ------------------------------------------------------------------------------------------------------------------ Invalid input(s): [place]  Patient is a 65 year old presenting with fever sepsis 1.  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 Unclear if she is currently being treated for this or not. 4.  Chronic back pain - may need a MRI if systems persist 5.  Arthritis - when necessary Tylenol continue fentanyl 6. Hypokalemia we'll replace her potassium  7. Sinus tachycardia will place on metoprolol  8. Anemia we'll check her iron and ferritin B12 folate guaiac her stools     Code Status Orders        Start     Ordered   10/29/15 0248  Full code   Continuous     10/29/15 0247    Code Status History    Date Active Date Inactive Code Status Order ID Comments User Context   This patient has a current code status but no historical code status.           Consults  none  DVT Prophylaxis  Lovenox    Lab Results  Component Value Date   PLT 287 10/29/2015     Time Spent in minutes   23min  Greater than 50% of time spent  in care coordination and counseling patient regarding the condition and plan of care.   Dustin Flock M.D on 10/29/2015 at 1:48 PM  Between 7am to 6pm - Pager - [phone]  After 6pm go to [email or web address] - password [email or web address] [facility]   Office  [phone]

## 2015-10-29 NOTE — Progress Notes (Signed)
Pharmacy Antibiotic Note  Theresa Mercado is a 65 y.o. female admitted on 10/28/2015 with sepsis/UTI.  Pharmacy has been consulted for vancomycin and Zosyn dosing.  Plan: After discussion with Dr. Stevenson Clinch, suspected source is urinary so will d/c vancomycin.   Continue Zosyn 3.375 grams q 8 hours ordered.   Height: 5\' 4"  (162.6 cm) Weight: 148 lb 3.2 oz (67.223 kg) IBW/kg (Calculated) : 54.7  Temp (24hrs), Avg:99.2 F (37.3 C), Min:97.8 F (36.6 C), Max:100.8 F (38.2 C)   Recent Labs Lab 10/28/15 1931 10/29/15 0732  WBC 18.0* 13.2*  CREATININE 0.55 0.39*  LATICACIDVEN  --  1.8    Estimated Creatinine Clearance: 67 mL/min (by C-G formula based on Cr of 0.39).    No Known Allergies  Antimicrobials this admission: vancomycin 6/5 >> 6/5 Zosyn  6/4 >>   Dose adjustments this admission:   Microbiology results: 6/5 BCx: NGTD x 2 6/5 UCx: pending  6/5 MRSA PCR: negative  6/5 CXR: R atelectasis vs. pneumonia 6/4 UA: LE(+) NO2(-) WBC 6-30   Thank you for allowing pharmacy to be a part of this patient's care.  Ulice Dash D 10/29/2015 1:31 PM

## 2015-10-29 NOTE — Progress Notes (Addendum)
Pharmacy Consult for Electrolyte Monitoring Indication: Hypokalemia  Allergies  Allergen Reactions  . Fosaprepitant Shortness Of Breath and Other (See Comments)    Reaction:  Flushing     Patient Measurements: Height: 5\' 4"  (162.6 cm) Weight: 148 lb 3.2 oz (67.223 kg) IBW/kg (Calculated) : 54.7  Vital Signs: Temp: 98.8 F (37.1 C) (06/05 2000) BP: 113/65 mmHg (06/05 2000) Pulse Rate: 108 (06/05 1100) Intake/Output from previous day: 06/04 0701 - 06/05 0700 In: 1078 [I.V.:828; IV Piggyback:250] Out: 550 [Urine:550] Intake/Output from this shift: Total I/O In: 65 [I.V.:65] Out: -   Labs:  Recent Labs  10/28/15 1931 10/29/15 0732  WBC 18.0* 13.2*  HGB 8.4* 7.6*  HCT 25.9* 24.4*  PLT 335 287     Recent Labs  10/28/15 1931 10/29/15 0732 10/29/15 1833  NA 135 138  --   K 3.3* 2.9* 3.1*  CL 94* 103  --   CO2 30 28  --   GLUCOSE 122* 98  --   BUN 17 12  --   CREATININE 0.55 0.39*  --   CALCIUM 8.4* 7.6*  --   MG  --  1.5*  --   PROT 6.2*  --   --   ALBUMIN 1.9*  --   --   AST 35  --   --   ALT 22  --   --   ALKPHOS 94  --   --   BILITOT 0.4  --   --    Estimated Creatinine Clearance: 67 mL/min (by C-G formula based on Cr of 0.39).    Recent Labs  10/29/15 0241  GLUCAP 113*    Medical History: Past Medical History  Diagnosis Date  . Hypertension   . Back pain, chronic   . Arthritis   . 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 enoxaparin (LOVENOX) injection  40 mg Subcutaneous Q24H  . fentaNYL  12.5 mcg Transdermal Q72H  . metoprolol  2.5 mg Intravenous Q8H  . piperacillin-tazobactam (ZOSYN)  IV  3.375 g Intravenous Q8H  . potassium chloride  10 mEq Intravenous Q1 Hr x 4  . sodium chloride  1,000 mL Intravenous Once   And  . sodium chloride  1,000 mL Intravenous Once  . sodium chloride flush  3 mL Intravenous Q12H   Infusions:  . sodium chloride 65 mL/hr at 10/29/15 1408     Assessment: Pharmacy consulted to assist in managing electrolytes in this 65 y/o F with sepsis/UTI.   Plan:  Potassium replaced with 40 meq po x 2. Magnesium pending. Will f/u magnesium and replace if necessary. Will recheck potassium at 1800.   6/5:  K @ 18:33 = 3.1 Will order KCl 10 mEq IV X 4 and recheck K on 6/6 @ 5:00.   Makylie Rivere D 10/29/2015,8:47 PM  Addendum:  Mag= 1.5. Will replace with magnesium sulfate 2 g iv once.

## 2015-10-29 NOTE — Progress Notes (Signed)
Alerted Dr. Marcille Blanco to pt. 10/10 pain post morphine and oxycodone PRN and tachycardia into 150's.  MD ordered dilaudid and 500 mL NS bolus

## 2015-10-29 NOTE — Progress Notes (Signed)
Pt. Very hard stick, 2 RN's tried in ED to place 2nd PIV, RN in ICU tried to place 2nd PIV w/o success.  Phlebotomists x 2 tried to get AM labs and were unable.

## 2015-10-29 NOTE — Progress Notes (Signed)
Alerted Dr. Marcille Blanco to pt.'s brief run of SVT into 180's while trying to manage pain, suggested adding ativan PRN-changing lidocaine to fentanyl patch and ordering a palliative care consult.

## 2015-10-29 NOTE — ED Notes (Signed)
Spoke with Dr Jannifer Franklin regarding patient's low blood pressure.  Per Dr Jannifer Franklin, patient's orders being changed to a stepdown unit.

## 2015-10-29 NOTE — Progress Notes (Signed)
Pharmacy Antibiotic Follow-up Note  Theresa Mercado is a 65 y.o. year-old female admitted on 10/28/2015.  The patient is currently on Zosyn for UTI.  Assessment/Plan: Lab reports [facility] strep sp in anaerobic bottle of 1 set. Spoke with Dr. Jannifer Franklin, continue current regimen for now.  Temp (24hrs), Avg:99.3 F (37.4 C), Min:98.1 F (36.7 C), Max:100.8 F (38.2 C)   Recent Labs Lab 10/28/15 1931 10/29/15 0732  WBC 18.0* 13.2*    Recent Labs Lab 10/28/15 1931 10/29/15 0732  CREATININE 0.55 0.39*   Estimated Creatinine Clearance: 67 mL/min (by C-G formula based on Cr of 0.39).    Allergies  Allergen Reactions  . Fosaprepitant Shortness Of Breath and Other (See Comments)    Reaction:  Flushing     Thank you for allowing pharmacy to be a part of this patient's care.  Laural Benes, Pharm.D., BCPS Clinical Pharmacist 10/29/2015 9:44 PM

## 2015-10-30 ENCOUNTER — Encounter: Payer: Self-pay | Admitting: Radiology

## 2015-10-30 ENCOUNTER — Inpatient Hospital Stay: Payer: Medicare HMO

## 2015-10-30 LAB — BASIC METABOLIC PANEL
Anion gap: 8 (ref 5–15)
BUN: 11 mg/dL (ref 6–20)
CHLORIDE: 106 mmol/L (ref 101–111)
CO2: 24 mmol/L (ref 22–32)
Calcium: 7.8 mg/dL — ABNORMAL LOW (ref 8.9–10.3)
Creatinine, Ser: 0.49 mg/dL (ref 0.44–1.00)
GFR calc non Af Amer: >60 mL/min (ref >60)
Glucose, Bld: 85 mg/dL (ref 65–99)
POTASSIUM: 4.5 mmol/L (ref 3.5–5.1)
SODIUM: 138 mmol/L (ref 135–145)

## 2015-10-30 LAB — CBC WITH DIFFERENTIAL/PLATELET
BASOS ABS: 0 10*3/uL (ref 0–0.1)
Basophils Relative: 0 %
EOS ABS: 0 10*3/uL (ref 0–0.7)
Eosinophils Relative: 0 %
HEMATOCRIT: 22.5 % — AB (ref 35.0–47.0)
HEMOGLOBIN: 7 g/dL — AB (ref 12.0–16.0)
LYMPHS PCT: 4 %
Lymphs Abs: 0.6 10*3/uL — ABNORMAL LOW (ref 1.0–3.6)
MCH: 26 pg (ref 26.0–34.0)
MCHC: 30.9 g/dL — ABNORMAL LOW (ref 32.0–36.0)
MCV: 84.2 fL (ref 80.0–100.0)
Monocytes Absolute: 1.4 10*3/uL — ABNORMAL HIGH (ref 0.2–0.9)
Monocytes Relative: 10 %
NEUTROS PCT: 86 %
Neutro Abs: 12 10*3/uL — ABNORMAL HIGH (ref 1.4–6.5)
Platelets: 256 10*3/uL (ref 150–440)
RBC: 2.67 MIL/uL — ABNORMAL LOW (ref 3.80–5.20)
RDW: 19.1 % — ABNORMAL HIGH (ref 11.5–14.5)
WBC: 14 10*3/uL — ABNORMAL HIGH (ref 3.6–11.0)

## 2015-10-30 LAB — ABO/RH: ABO/RH(D): O POS

## 2015-10-30 LAB — PHOSPHORUS: PHOSPHORUS: 2.1 mg/dL — AB (ref 2.5–4.6)

## 2015-10-30 LAB — URINE CULTURE

## 2015-10-30 LAB — PREPARE RBC (CROSSMATCH)

## 2015-10-30 LAB — MAGNESIUM: MAGNESIUM: 1.8 mg/dL (ref 1.7–2.4)

## 2015-10-30 MED ORDER — DILTIAZEM HCL 25 MG/5ML IV SOLN
10.0000 mg | Freq: Four times a day (QID) | INTRAVENOUS | Status: DC | PRN
  Administered 2015-10-30: 10 mg via INTRAVENOUS

## 2015-10-30 MED ORDER — IOPAMIDOL (ISOVUE-370) INJECTION 76%
100.0000 mL | Freq: Once | INTRAVENOUS | Status: AC | PRN
  Administered 2015-10-30: 100 mL via INTRAVENOUS

## 2015-10-30 MED ORDER — SODIUM PHOSPHATES 45 MMOLE/15ML IV SOLN
15.0000 mmol | Freq: Once | INTRAVENOUS | Status: AC
  Administered 2015-10-30: 15 mmol via INTRAVENOUS
  Filled 2015-10-30: qty 5

## 2015-10-30 MED ORDER — DILTIAZEM HCL 25 MG/5ML IV SOLN
INTRAVENOUS | Status: AC
  Administered 2015-10-30: 10 mg via INTRAVENOUS
  Filled 2015-10-30: qty 5

## 2015-10-30 MED ORDER — METOPROLOL SUCCINATE ER 50 MG PO TB24
50.0000 mg | ORAL_TABLET | Freq: Every day | ORAL | Status: DC
  Administered 2015-10-30 – 2015-11-01 (×3): 50 mg via ORAL
  Filled 2015-10-30 (×3): qty 1

## 2015-10-30 MED ORDER — SODIUM CHLORIDE 0.9 % IV SOLN
Freq: Once | INTRAVENOUS | Status: AC
  Administered 2015-10-30: 14:00:00 via INTRAVENOUS

## 2015-10-30 MED ORDER — DILTIAZEM HCL 100 MG IV SOLR
5.0000 mg/h | INTRAVENOUS | Status: DC
  Administered 2015-10-30: 15 mg/h via INTRAVENOUS
  Administered 2015-10-30 – 2015-10-31 (×2): 5 mg/h via INTRAVENOUS
  Filled 2015-10-30 (×3): qty 100

## 2015-10-30 MED ORDER — IRON SUCROSE 20 MG/ML IV SOLN
500.0000 mg | Freq: Once | INTRAVENOUS | Status: AC
  Administered 2015-10-30: 500 mg via INTRAVENOUS
  Filled 2015-10-30: qty 25

## 2015-10-30 MED ORDER — DILTIAZEM HCL 60 MG PO TABS
60.0000 mg | ORAL_TABLET | Freq: Three times a day (TID) | ORAL | Status: DC
  Administered 2015-10-30 – 2015-10-31 (×4): 60 mg via ORAL
  Filled 2015-10-30 (×4): qty 1

## 2015-10-30 NOTE — Progress Notes (Signed)
Pharmacy Antibiotic Note  Theresa Mercado is a 65 y.o. female admitted on 10/28/2015 with sepsis/UTI.  Pharmacy has been consulted for vancomycin and Zosyn dosing. Vancomycin d/c 6/5.   Plan: Continue Zosyn 3.375 grams q 8 hours.   Height: 5\' 4"  (162.6 cm) Weight: 148 lb 3.2 oz (67.223 kg) IBW/kg (Calculated) : 54.7  Temp (24hrs), Avg:99.4 F (37.4 C), Min:98.6 F (37 C), Max:100.4 F (38 C)   Recent Labs Lab 10/28/15 1931 10/29/15 0732 10/30/15 0147  WBC 18.0* 13.2* 14.0*  CREATININE 0.55 0.39* 0.49  LATICACIDVEN  --  1.8  --     Estimated Creatinine Clearance: 67 mL/min (by C-G formula based on Cr of 0.49).    Allergies  Allergen Reactions  . Fosaprepitant Shortness Of Breath and Other (See Comments)    Reaction:  Flushing     Antimicrobials this admission: vancomycin 6/5 >> 6/5 Zosyn  6/4 >>   Dose adjustments this admission:   Microbiology results: 6/5 BCx: Streptococcus species in 1/2 6/5 UCx: mx species 6/5 MRSA PCR: negative  6/5 CXR: R atelectasis vs. pneumonia 6/4 UA: LE(+) NO2(-) WBC 6-30   Thank you for allowing pharmacy to be a part of this patient's care.  Napoleon Form 10/30/2015 3:33 PM

## 2015-10-30 NOTE — Care Management (Signed)
Patient is requiring IV cardizem infusion for heart rate 170-190.  Chest CT pending- can not proceed until cardiac status stabilizes.  It has been reported that patient did not want information shared with her family.  Patient's daughter Wilburn Cornelia has established password and there  is no problem with information being sharred with family members.  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 she is currently followed by [facility]

## 2015-10-30 NOTE — Progress Notes (Signed)
[place] [facility]                                                                                                                                                                                            Patient Demographics   Theresa Mercado, is a 65 y.o. female, DOB - 1951/05/13, [id number]  Admit date - 10/28/2015   Admitting Physician Lance Coon, MD  Outpatient Primary MD for the patient is [place]   LOS - 2  Subjective:Continues to complain of pain in the back and chest. I reviewed her previous records states that she has a history of mediastinal fistula. Patient's heart rate went up into the 200s. With A. fib with RVR.   Review of Systems:   CONSTITUTIONAL: Positive fever. Positive fatigue, weakness. No weight gain, no weight loss.  EYES: No blurry or double vision.  ENT: No tinnitus. No postnasal drip. No redness of the oropharynx.  RESPIRATORY: No cough, no wheeze, no hemoptysis. No dyspnea.  CARDIOVASCULAR: Positive chest pain. No orthopnea. No palpitations. No syncope.  GASTROINTESTINAL: No nausea, no vomiting or diarrhea. Positive abdominal pain. No melena or hematochezia.  GENITOURINARY: No dysuria or hematuria.  ENDOCRINE: No polyuria or nocturia. No heat or cold intolerance.  HEMATOLOGY: No anemia. No bruising. No bleeding.  INTEGUMENTARY: No rashes. No lesions.  MUSCULOSKELETAL: No arthritis. No swelling. No gout. Positive back pain NEUROLOGIC: No numbness, tingling, or ataxia. No seizure-type activity.  PSYCHIATRIC: No anxiety. No insomnia. No ADD.    Vitals:   Filed Vitals:   10/30/15 0800 10/30/15 0900 10/30/15 1000 10/30/15 1010  BP: 112/56 120/66 95/71 103/65  Pulse:    83  Temp: 99.7 F (37.6 C) 100 F (37.8 C) 100.4 F (38 C) 100.4 F (38 C)  TempSrc:      Resp: 28 29 15 18   Height:      Weight:      SpO2: 100% 100%  94%    Wt Readings from Last 3 Encounters:  10/28/15 67.223 kg (148  lb 3.2 oz)  09/30/15 73.392 kg (161 lb 12.8 oz)  07/14/15 79.833 kg (176 lb)     Intake/Output Summary (Last 24 hours) at 10/30/15 1227 Last data filed at 10/30/15 1100  Gross per 24 hour  Intake   2020 ml  Output   1000 ml  Net   1020 ml    Physical Exam:   GENERAL: Pleasant-appearing in no apparent distress.  HEAD, EYES, EARS, NOSE AND THROAT: Atraumatic, normocephalic. Extraocular muscles are intact. Pupils equal and reactive to light. Sclerae anicteric. No conjunctival injection. No oro-pharyngeal  erythema.  NECK: Supple. There is no jugular venous distention. No bruits, no lymphadenopathy, no thyromegaly.  HEART: Irregularly irregular rhythm,. No murmurs, no rubs, no clicks.  LUNGS: Clear to auscultation bilaterally. No rales or rhonchi. No wheezes.  ABDOMEN: Soft, flat, nontender, nondistended. Has good bowel sounds. No hepatosplenomegaly appreciated.  EXTREMITIES: No evidence of any cyanosis, clubbing, or peripheral edema.  +2 pedal and radial pulses bilaterally.  NEUROLOGIC: The patient is alert, awake, and oriented x3 with no focal motor or sensory deficits appreciated bilaterally.  SKIN: Moist and warm with no rashes appreciated.  Psych: Not anxious, depressed LN: No inguinal LN enlargement    Antibiotics   Anti-infectives    Start     Dose/Rate Route Frequency Ordered Stop   10/29/15 1500  vancomycin (VANCOCIN) 1,250 mg in sodium chloride 0.9 % 250 mL IVPB  Status:  Discontinued     1,250 mg 166.7 mL/hr over 90 Minutes Intravenous Every 18 hours 10/29/15 0301 10/29/15 1048   10/29/15 0400  vancomycin (VANCOCIN) 1,250 mg in sodium chloride 0.9 % 250 mL IVPB     1,250 mg 166.7 mL/hr over 90 Minutes Intravenous  Once 10/29/15 0301 10/29/15 0515   10/29/15 0248  vancomycin (VANCOCIN) IVPB 1000 mg/200 mL premix  Status:  Discontinued     1,000 mg 200 mL/hr over 60 Minutes Intravenous Every 12 hours 10/29/15 0248 10/29/15 0301   10/29/15 0248  piperacillin-tazobactam  (ZOSYN) IVPB 3.375 g     3.375 g 12.5 mL/hr over 240 Minutes Intravenous Every 8 hours 10/29/15 0248     10/28/15 2330  piperacillin-tazobactam (ZOSYN) IVPB 3.375 g     3.375 g 100 mL/hr over 30 Minutes Intravenous  Once 10/28/15 2318 10/29/15 0034      Medications   Scheduled Meds: . sodium chloride   Intravenous Once  . diltiazem  60 mg Oral Q8H  . enoxaparin (LOVENOX) injection  40 mg Subcutaneous Q24H  . fentaNYL  12.5 mcg Transdermal Q72H  . metoprolol  2.5 mg Intravenous Q8H  . piperacillin-tazobactam (ZOSYN)  IV  3.375 g Intravenous Q8H  . sodium chloride  1,000 mL Intravenous Once   And  . sodium chloride  1,000 mL Intravenous Once  . sodium chloride flush  3 mL Intravenous Q12H  . sodium phosphate  Dextrose 5% IVPB  15 mmol Intravenous Once   Continuous Infusions: . sodium chloride 65 mL/hr at 10/30/15 0700  . diltiazem (CARDIZEM) infusion 10 mg/hr (10/30/15 1113)   PRN Meds:.acetaminophen **OR** acetaminophen, diltiazem, HYDROmorphone (DILAUDID) injection, LORazepam, morphine injection, ondansetron **OR** ondansetron (ZOFRAN) IV, oxyCODONE   Data Review:   Micro Results Recent Results (from the past 240 hour(s))  Urine culture     Status: Abnormal   Collection Time: 10/28/15  9:15 PM  Result Value Ref Range Status   Specimen Description URINE, CLEAN CATCH  Final   Special Requests NONE  Final   Culture MULTIPLE SPECIES PRESENT, SUGGEST RECOLLECTION (A)  Final   Report Status 10/30/2015 FINAL  Final  Blood Culture (routine x 2)     Status: None (Preliminary result)   Collection Time: 10/29/15 12:00 AM  Result Value Ref Range Status   Specimen Description BLOOD LEFT ANTECUBITAL  Final   Special Requests BOTTLES DRAWN AEROBIC AND ANAEROBIC [place]  Final   Culture  Setup Time   Final    GRAM POSITIVE COCCI ANAEROBIC BOTTLE ONLY CRITICAL RESULT CALLED TO, READ BACK BY AND VERIFIED WITH: [place] ON 10/29/15 AT [address]137 [facility] CONFIRMED BY  DV Performed at  [facility]    Culture   Final    [employer] IN BOTH AEROBIC AND ANAEROBIC BOTTLES IDENTIFICATION TO FOLLOW    Report Status PENDING  Incomplete  Blood Culture (routine x 2)     Status: None (Preliminary result)   Collection Time: 10/29/15 12:00 AM  Result Value Ref Range Status   Specimen Description BLOOD LEFT ASSIST CONTROL  Final   Special Requests BOTTLES DRAWN AEROBIC AND ANAEROBIC [place]  Final   Culture NO GROWTH < 12 HOURS  Final   Report Status PENDING  Incomplete  Blood Culture ID Panel (Reflexed)     Status: Abnormal   Collection Time: 10/29/15 12:00 AM  Result Value Ref Range Status   Enterococcus species NOT DETECTED NOT DETECTED Final   Vancomycin resistance NOT DETECTED NOT DETECTED Final   Listeria monocytogenes NOT DETECTED NOT DETECTED Final   Staphylococcus species NOT DETECTED NOT DETECTED Final   Staphylococcus aureus NOT DETECTED NOT DETECTED Final   Methicillin resistance NOT DETECTED NOT DETECTED Final   Streptococcus species DETECTED (A) NOT DETECTED Final    Comment: CRITICAL RESULT CALLED TO, READ BACK BY AND VERIFIED WITH: NATE COOKSON ON 10/29/15 AT 2137 [facility]    Streptococcus agalactiae NOT DETECTED NOT DETECTED Final   Streptococcus pneumoniae NOT DETECTED NOT DETECTED Final   Streptococcus pyogenes NOT DETECTED NOT DETECTED Final   Acinetobacter baumannii NOT DETECTED NOT DETECTED Final   Enterobacteriaceae species NOT DETECTED NOT DETECTED Final   Enterobacter cloacae complex NOT DETECTED NOT DETECTED Final   Escherichia coli NOT DETECTED NOT DETECTED Final   Klebsiella oxytoca NOT DETECTED NOT DETECTED Final   Klebsiella pneumoniae NOT DETECTED NOT DETECTED Final   Proteus species NOT DETECTED NOT DETECTED Final   Serratia marcescens NOT DETECTED NOT DETECTED Final   Carbapenem resistance NOT DETECTED NOT DETECTED Final   Haemophilus influenzae NOT DETECTED NOT DETECTED Final   Neisseria meningitidis NOT DETECTED NOT  DETECTED Final   Pseudomonas aeruginosa NOT DETECTED NOT DETECTED Final   Candida albicans NOT DETECTED NOT DETECTED Final   Candida glabrata NOT DETECTED NOT DETECTED Final   Candida krusei NOT DETECTED NOT DETECTED Final   Candida parapsilosis NOT DETECTED NOT DETECTED Final   Candida tropicalis NOT DETECTED NOT DETECTED Final  MRSA PCR Screening     Status: None   Collection Time: 10/29/15  2:46 AM  Result Value Ref Range Status   MRSA by PCR NEGATIVE NEGATIVE Final    Comment:        The GeneXpert MRSA Assay (FDA approved for NASAL specimens only), is one component of a comprehensive MRSA colonization surveillance program. It is not intended to diagnose MRSA infection nor to guide or monitor treatment for MRSA infections.     Radiology Reports Dg Chest 1 View  10/29/2015  CLINICAL DATA:  Sepsis, metastatic disease to the right lung EXAM: CHEST 1 VIEW COMPARISON:  Portable chest x-ray of October 28, 2015 FINDINGS: There remains volume loss on the right with large pleural effusion. The left lung is well-expanded and exhibits stable linear density in its midportion new since 2015 but not greatly changed since Sep 30, 2015. The left heart border is normal. The right heart border is obscured. The mediastinum is mildly widened though stable. The bony thorax exhibits no acute abnormality. IMPRESSION: Slight interval increase in the volume of pleural fluid on the right. Underlying atelectasis or consolidation of the right middle and lower lobes is suspected. No  pulmonary edema. Stable atelectasis or scarring in the left mid lung. Electronically Signed   By: David  Martinique M.D.   On: 10/29/2015 07:28   Dg Thoracic Spine 2 View  10/28/2015  CLINICAL DATA:  Dorsalgia.  History of lung carcinoma EXAM: THORACIC SPINE 3 VIEWS COMPARISON:  August 13, 2014 thoracic spine radiographs and chest CT with bony reformats Sep 30, 2015 FINDINGS: Frontal, lateral, and swimmer's views were obtained. There is no  fracture or spondylolisthesis. A sclerotic focus in the T8 vertebral body is better appreciated by CT but is present on this study. There is mild disc space narrowing at multiple levels. No erosive change or paraspinous lesion. An esophageal stent is present. IMPRESSION: Sclerotic focus in the T8 vertebral body concerning for a sclerotic metastasis is present, better seen on recent CT. No new bone lesion evident. Mild disc space narrowing at several levels. No fracture or spondylolisthesis. Electronically Signed   By: Lowella Grip III M.D.   On: 10/28/2015 18:39   Dg Lumbar Spine 2-3 Views  10/28/2015  CLINICAL DATA:  Lumbago.  History of lung carcinoma EXAM: LUMBAR SPINE - 2-3 VIEW COMPARISON:  Lumbar MRI March 25, 2014; CT abdomen pelvis with bony reformats Oct 09, 2014 FINDINGS: Frontal, lateral, and spot lumbosacral lateral images were obtained. There are 5 non-rib-bearing lumbar type vertebral bodies. There is no fracture or spondylolisthesis. There is mild disc space narrowing at L5-S1. Other disc spaces appear normal. No blastic or lytic bone lesions identified. No erosive change. There are multiple calcified leiomyomas noted in the pelvis. IMPRESSION: Scoliosis. Mild osteoarthritic change at L5-S1. No fracture or spondylolisthesis. No blastic or lytic lesions evident. Uterine leiomyomas, calcified, in the pelvis. Electronically Signed   By: Lowella Grip III M.D.   On: 10/28/2015 18:41   Ct Angio Chest Pe W/cm &/or Wo Cm  09/30/2015  CLINICAL DATA:  Shortness of breath.  History of lung carcinoma EXAM: CT ANGIOGRAPHY CHEST WITH CONTRAST TECHNIQUE: Multidetector CT imaging of the chest was performed using the standard protocol during bolus administration of intravenous contrast. Multiplanar CT image reconstructions and MIPs were obtained to evaluate the vascular anatomy. CONTRAST:  75 mL Isovue 370 nonionic COMPARISON:  Chest CT January 08, 2014 and chest radiograph Sep 30, 2015 FINDINGS:  Mediastinum/Lymph Nodes: There is no demonstrable pulmonary embolus. There is no thoracic aortic aneurysm or dissection. The visualized great vessels appear normal. Pericardium is not thickened. Thyroid appears somewhat inhomogeneous without well-defined dominant mass. There is no appreciable thoracic adenopathy. There is thickening of the mid the distal esophagus. There is a complex collection of soft tissue material with air extending from the esophagus into the posterior right mediastinum and left lower lobe region, concerning for a fistula between the esophagus and adjacent mediastinum with likely extension into the right lower lobe medially. There is irregular consolidation in this area. Postoperative changes noted in this area. Lungs/Pleura: There is a focal right pleural effusion. There is patchy consolidation in the lateral segment of the right middle lobe as well as consolidation in the area concerning for fistula between the esophagus and adjacent mediastinum and medial right base regions. There is a nodular opacity in the anterior segment of the left upper lobe measuring 4 x 3 mm seen on axial slice 38 series 6. There is a 3 mm nodular opacity in the posterior segment of the left upper lobe seen on axial slice 39 series 6. There is a nodular opacity in the apical segment of the right upper  lobe measuring 6 x 4 mm on axial slice 21 series 6. There is a 6 x 5 mm nodular opacity in the anterior segment of the right upper lobe seen on axial slice 32 series 6. There is a 3 mm nodular opacity in right upper lobe on axial slice 42 series 6. There is a nodular opacity in the anterior segment of the right upper lobe on axial slice 54 series 6 measuring 7 x 6 mm. There is a nodular opacity in the medial segment of the right middle lobe on axial slice 75 series 6 measuring 6 x 5 mm. There is a nodular opacity in the lingula on axial slice 92 series 6 measuring 1.0 x 0.8 cm. There is airspace consolidation in the  posterior segment of the left upper lobe which has an appearance most consistent with pneumonia. Upper abdomen: In the visualized upper abdomen, there is hepatic steatosis. There is a cystic lesion in the dome of the liver on the left measuring 1.3 x 1.0 cm. Visualized upper abdominal structures otherwise appear unremarkable. Musculoskeletal: There is a sclerotic lesion in the T8 vertebral body, concerning for a metastatic focus. No lytic or destructive bone lesions are evident. Review of the MIP images confirms the above findings. IMPRESSION: No demonstrable pulmonary embolus. Complex collection of air and soft tissue material which appears to track from the esophagus into the posterior mediastinum on the right and into the post rib medial right lung base region. Suspect developing abscess due to fistula between the esophagus and mediastinum/pleural surface on the right. There is nearby right lung base consolidation as well as loculated effusion in this area. There may be a degree of bronchopleural fistula in this area as well. This area may well warrant direct visualization to further assess. Airspace consolidation posterior segment left upper lobe, consistent with pneumonia. Fairly small but present right pleural effusion. Multiple nodular opacities in the lungs, felt to be consistent with metastatic foci. These lesions were not present on prior CT. No appreciable adenopathy evident. Sclerotic focus in the T8 vertebral body, felt to represent a bony metastasis. It was present on the prior CT suggesting quiescent metastasis. Critical Value/emergent results were called by telephone at the time of interpretation on 09/30/2015 at 2:50 pm to Dr. Hinda Kehr , who verbally acknowledged these results. Electronically Signed   By: Lowella Grip III M.D.   On: 09/30/2015 14:54   Dg Chest Port 1 View  10/29/2015  CLINICAL DATA:  Sepsis EXAM: PORTABLE CHEST 1 VIEW COMPARISON:  09/30/2015 FINDINGS: Moderate to large  right pleural effusion that is complex, non layering. There is underlying atelectasis and/or pneumonia. Linear opacity in the left mid lung is chronic and likely scar. Esophageal metallic stent and neighboring plastic stents which by abdominal CT extend into a right paraesophageal collection. No pneumothorax or edema. Nonspecific mediastinal widening, accentuated by rotation. Lung sutures on the right. IMPRESSION: Large complex right pleural effusion with underlying atelectasis or pneumonia, new from exam 1 month prior but similar findings reported on chest CT from [facility] 10/07/2015. 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 Electronically Signed   By: Monte Fantasia M.D.   On: 10/29/2015 00:08   Dg Chest Portable 1 View  09/30/2015  CLINICAL DATA:  Shortness of breath, mid upper back pain, history lung cancer in remission, hypertension EXAM: PORTABLE CHEST 1 VIEW COMPARISON:  Portable exam 1307 hours compared 01/08/2014 FINDINGS: Heart size, mediastinal contours and pulmonary vascularity. Volume loss RIGHT hemi thorax.  Postsurgical changes RIGHT upper lobe. Subsegmental atelectasis LEFT upper lobe. Underlying emphysematous changes. No infiltrate, pleural effusion or pneumothorax. Bones demineralized. IMPRESSION: Postsurgical changes RIGHT lung. Underlying COPD with subsegmental atelectasis LEFT upper lobe. Electronically Signed   By: Lavonia Dana M.D.   On: 09/30/2015 14:22   Ct Renal Stone Study  10/28/2015  CLINICAL DATA:  65 year old female with acute or chronic back pain. Patient has known lung cancer. EXAM: CT ABDOMEN AND PELVIS WITHOUT CONTRAST TECHNIQUE: Multidetector CT imaging of the abdomen and pelvis was performed following the standard protocol without IV contrast. COMPARISON:  CT dated 10/09/2014 FINDINGS: Evaluation of this exam is limited in the absence of intravenous contrast. There are postsurgical changes of the visualized portions of the right lung  with areas of consolidation. There is a 2.8 x 3.6 cm paramediastinal collection at the right lung base. A pigtail drainage catheter extends from this collection into the esophagus and terminates within the stomach. There is partially visualized loculated appearing right-sided pleural effusion. There is a 5 mm nodule in the right lung base anteriorly. A 9 mm nodule in the left upper lobe/lingula has increased in size compared to the prior study (measured approximately 6 mm on the CT dated 10/09/2014). No intra-abdominal free air or free fluid. There is a 1.3 cm hypodense lesion in the left lobe of the liver similar to prior study of which is not well characterized on this study but may represent a cyst or hemangioma. The gallbladder, pancreas appear unremarkable. Small scattered calcified splenic granuloma. The adrenal glands appear unremarkable. Multiple small nonobstructing bilateral renal calculi measuring up to 3 mm in the upper pole of the right kidney. There is no hydronephrosis on either side. The visualized ureters and the urinary bladder appear unremarkable. There multiple fibroids within the uterus cell which are partially calcified. An esophageal stent is partially visualized. A there is no evidence of bowel obstruction or active inflammation. Normal appendix. The abdominal aorta and IVC are grossly unremarkable on this noncontrast study. No portal venous gas identified. There is no adenopathy. A 5 mm nodular density noted in the superficial subcutaneous soft tissues of the right anterior pelvic wall, nonspecific. There is a 1 cm nodular density in the subcutaneous soft tissues of the left anterior pelvic wall (series 2, image 70). The abdominal wall soft tissues appear unremarkable. There is stable sclerotic focus in the S3 vertebrae as well as a stable focus of sclerosis in the superior aspect of the T8 vertebra. This findings are nonspecific. IMPRESSION: Small nonobstructing bilateral renal calculi.  No  hydronephrosis. Postsurgical changes of the right lung base with an area of consolidative/infiltrative change and partially visualized right pleural effusion. Focal area of collection at the right lung base. There is a drainage catheter extending from this collection into the stomach. Bilateral pulmonary nodules appear increased in size compared to prior study. Electronically Signed   By: Anner Crete M.D.   On: 10/28/2015 22:28     CBC  Recent Labs Lab 10/28/15 1931 10/29/15 0732 10/30/15 0147  WBC 18.0* 13.2* 14.0*  HGB 8.4* 7.6* 7.0*  HCT 25.9* 24.4* 22.5*  PLT 335 287 256  MCV 82.4 84.8 84.2  MCH 26.6 26.4 26.0  MCHC 32.3 31.2* 30.9*  RDW 18.6* 18.8* 19.1*  LYMPHSABS 0.4*  --  0.6*  MONOABS 1.0*  --  1.4*  EOSABS 0.0  --  0.0  BASOSABS 0.0  --  0.0    Chemistries   Recent Labs Lab 10/28/15 1931 10/29/15  0732 10/29/15 1833 10/30/15 0147  NA 135 138  --  138  K 3.3* 2.9* 3.1* 4.5  CL 94* 103  --  106  CO2 30 28  --  24  GLUCOSE 122* 98  --  85  BUN 17 12  --  11  CREATININE 0.55 0.39*  --  0.49  CALCIUM 8.4* 7.6*  --  7.8*  MG  --  1.5*  --  1.8  AST 35  --   --   --   ALT 22  --   --   --   ALKPHOS 94  --   --   --   BILITOT 0.4  --   --   --    ------------------------------------------------------------------------------------------------------------------ estimated creatinine clearance is 67 mL/min (by C-G formula based on Cr of 0.49). ------------------------------------------------------------------------------------------------------------------ No results for input(s): HGBA1C in the last 72 hours. ------------------------------------------------------------------------------------------------------------------ No results for input(s): CHOL, HDL, LDLCALC, TRIG, CHOLHDL, LDLDIRECT in the last 72 hours. ------------------------------------------------------------------------------------------------------------------ No results for input(s): TSH,  T4TOTAL, T3FREE, THYROIDAB in the last 72 hours.  Invalid input(s): FREET3 ------------------------------------------------------------------------------------------------------------------  Recent Labs  10/29/15 0732  VITAMINB12 575  FOLATE 5.3*  FERRITIN 877*  TIBC NOT CALCULATED  IRON <5*    Coagulation profile No results for input(s): INR, PROTIME in the last 168 hours.  No results for input(s): DDIMER in the last 72 hours.  Cardiac Enzymes No results for input(s): CKMB, TROPONINI, MYOGLOBIN in the last 168 hours.  Invalid input(s): CK ------------------------------------------------------------------------------------------------------------------ Invalid input(s): [place]  Patient is a 65 year old presenting with fever sepsis 1.  Sepsis ([place]) -  Due to urinary tract infection CT of the chest due to history of mediastinal esophageal fistula I suspect this to be the source Continue Vanco and Zosyn 2. A. 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 Unclear if she is currently being treated for this or not. 4.  Chronic back pain -  5.  Arthritis - when necessary Tylenol continue fentanyl 6. Hypokalemia we'll replace her potassium  7. Anemia severe iron deficiency however ferritin is high likelihood due to  inflammation due to her tachycardia will go ahead and transfuse the patient risk and benefits explained patient agreeable     Code Status Orders        Start     Ordered   10/29/15 0248  Full code   Continuous     10/29/15 0247    Code Status History    Date Active Date Inactive Code Status Order ID Comments User Context   This patient has a current code status but no historical code status.           Consults  none  DVT  Prophylaxis  scd's  Lab Results  Component Value Date   PLT 256 10/30/2015     Time Spent in minutes   53min  Greater than 50% of time spent in care coordination and counseling patient regarding the condition and plan of care.   Dustin Flock M.D on 10/30/2015 at 12:27 PM  Between 7am to 6pm - Pager - [phone]  After 6pm go to [email or web address] - password [email or web address] [place]   Office  [phone]

## 2015-10-30 NOTE — Progress Notes (Signed)
[place] Dr. Ulysees Barns notified of results of chest CT.

## 2015-10-30 NOTE — Progress Notes (Signed)
MD returned page, ordered cardizem IV infusion to maintain heart rate less than 110.

## 2015-10-30 NOTE — Progress Notes (Signed)
When updated patient's daughter about plan of care including plan with IR tomorrow, patient's daughter stated that two weeks ago when patient was at [facility] that she had had a drain placed in her lungs to drain fluid.

## 2015-10-30 NOTE — Progress Notes (Signed)
Patient had pain flare up, intially only 6/10, but increased rapidly to 10/10. Intially PO oxycodone had been given, but with increase RN gave prn dilaudid. When pain increased patient's heart rate also increased to over 200, appeared to be afib. PO cardizem already given, so RN paged MD, Dr. Posey Pronto. Dr. Posey Pronto acknowledged and ordered cardizem IV push. RN administered (BP 105/69), immediately rate decreased to 170s. RN will continue to monitor and contact MD if rate does not continue to lower.

## 2015-10-30 NOTE — Progress Notes (Signed)
Heart rate still elevated in 170s-190s, repaged MD.

## 2015-10-30 NOTE — Progress Notes (Signed)
Pharmacy Consult for Electrolyte Monitoring Indication: Hypokalemia  Allergies  Allergen Reactions  . Fosaprepitant Shortness Of Breath and Other (See Comments)    Reaction:  Flushing     Patient Measurements: Height: 5\' 4"  (162.6 cm) Weight: 148 lb 3.2 oz (67.223 kg) IBW/kg (Calculated) : 54.7  Vital Signs: Temp: 100.2 F (37.9 C) (06/06 1455) Temp Source: Core (Comment) (06/06 1455) BP: 95/59 mmHg (06/06 1455) Pulse Rate: 102 (06/06 1455) Intake/Output from previous day: 06/05 0701 - 06/06 0700 In: 1969 [I.V.:1719; IV Piggyback:250] Out: 1150 [Urine:1150] Intake/Output from this shift: Total I/O In: 715 [P.O.:200; I.V.:260; IV Piggyback:255] Out: 350 [Urine:350]  Labs:  Recent Labs  10/28/15 1931 10/29/15 0732 10/30/15 0147  WBC 18.0* 13.2* 14.0*  HGB 8.4* 7.6* 7.0*  HCT 25.9* 24.4* 22.5*  PLT 335 287 256     Recent Labs  10/28/15 1931 10/29/15 0732 10/29/15 1833 10/30/15 0147  NA 135 138  --  138  K 3.3* 2.9* 3.1* 4.5  CL 94* 103  --  106  CO2 30 28  --  24  GLUCOSE 122* 98  --  85  BUN 17 12  --  11  CREATININE 0.55 0.39*  --  0.49  CALCIUM 8.4* 7.6*  --  7.8*  MG  --  1.5*  --  1.8  PHOS  --   --   --  2.1*  PROT 6.2*  --   --   --   ALBUMIN 1.9*  --   --   --   AST 35  --   --   --   ALT 22  --   --   --   ALKPHOS 94  --   --   --   BILITOT 0.4  --   --   --    Estimated Creatinine Clearance: 67 mL/min (by C-G formula based on Cr of 0.49).    Recent Labs  10/29/15 0241  GLUCAP 113*    Medical History: Past Medical History  Diagnosis Date  . Hypertension   . Back pain, chronic   . Arthritis   . 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 diltiazem  60 mg Oral Q8H  . fentaNYL  12.5 mcg Transdermal Q72H  . iron sucrose  500 mg Intravenous Once  . metoprolol  2.5 mg Intravenous Q8H  . metoprolol succinate  50 mg Oral Daily  . piperacillin-tazobactam (ZOSYN)  IV  3.375 g Intravenous  Q8H  . sodium chloride  1,000 mL Intravenous Once   And  . sodium chloride  1,000 mL Intravenous Once  . sodium chloride flush  3 mL Intravenous Q12H   Infusions:  . sodium chloride 65 mL/hr at 10/30/15 0700  . diltiazem (CARDIZEM) infusion 15 mg/hr (10/30/15 1130)    Assessment: Pharmacy consulted to assist in managing electrolytes in this 65 y/o F with sepsis/UTI.   Plan:  Phosphorus replaced with sodium phosphate 15 mmol iv once. Will f/u AM labs.   Ulice Dash D 10/30/2015,3:30 PM

## 2015-10-30 NOTE — Progress Notes (Signed)
Theresa Mercado is a 65 y.o. female  [id number]  Primary Cardiologist: Neoma Laming Reason for Consultation: Atrial fibrillation  HPI: This is a 65 year old African-American female with a past medical history of cancer and hypertension presented to the hospital with chest pain palpitation and shortness of breath. I was asked to evaluate the patient because patient was having sinus tachycardia and intermittent possible atrial fibrillation. Patient still has intermittent chest pain but is feeling much better compared to when she first came in. Blood cultures is showing streptococci.   Review of Systems: Patient does have chest pain but denies any orthopnea PND or leg swelling   Past Medical History  Diagnosis Date  . Hypertension   . Back pain, chronic   . Arthritis   . 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 acetaminophen (TYLENOL) 500 MG tablet Take 1,000 mg by mouth every 8 (eight) hours as needed for mild pain or headache.    . cyclobenzaprine (FLEXERIL) 5 MG tablet Take 5-10 mg by mouth 2 (two) times daily as needed for muscle spasms.    . ferrous sulfate 325 (65 FE) MG tablet Take 325 mg by mouth 2 (two) times daily with a meal.    . gabapentin (NEURONTIN) 300 MG capsule Take 600 mg by mouth 3 (three) times daily.    Marland Kitchen lidocaine (LIDODERM) 5 % Place 2 patches onto the skin daily. Remove & Discard patch within 12 hours or as directed by MD    . lisinopril (PRINIVIL,ZESTRIL) 5 MG tablet Take 5 mg by mouth daily.    . magnesium oxide (MAG-OX) 400 (241.3 Mg) MG tablet Take 800 mg by mouth 2 (two) times daily.    . Melatonin 3 MG TABS Take 3 mg by mouth at bedtime.    Marland Kitchen omeprazole (PRILOSEC) 20 MG capsule Take 40 mg by mouth daily.    Marland Kitchen oxyCODONE-acetaminophen (PERCOCET/ROXICET) 5-325 MG tablet Take 1 tablet by mouth 2 (two) times daily as needed (for breakthrough pain).    Marland Kitchen senna (SENOKOT) 8.6 MG  tablet Take 2 tablets by mouth 2 (two) times daily as needed for constipation.    . traMADol (ULTRAM) 50 MG tablet Take 50 mg by mouth 3 (three) times daily as needed for moderate pain.   0  . traZODone (DESYREL) 50 MG tablet Take 50 mg by mouth at bedtime.       . sodium chloride   Intravenous Once  . diltiazem  60 mg Oral Q8H  . enoxaparin (LOVENOX) injection  40 mg Subcutaneous Q24H  . fentaNYL  12.5 mcg Transdermal Q72H  . metoprolol  2.5 mg Intravenous Q8H  . piperacillin-tazobactam (ZOSYN)  IV  3.375 g Intravenous Q8H  . sodium chloride  1,000 mL Intravenous Once   And  . sodium chloride  1,000 mL Intravenous Once  . sodium chloride flush  3 mL Intravenous Q12H  . sodium phosphate  Dextrose 5% IVPB  15 mmol Intravenous Once    Infusions: . sodium chloride 65 mL/hr at 10/30/15 0700  . diltiazem (CARDIZEM) infusion 10 mg/hr (10/30/15 1113)    Allergies  Allergen Reactions  . Fosaprepitant Shortness Of Breath and Other (See Comments)    Reaction:  Flushing     Social History   Social History  . Marital Status: Widowed    Spouse Name: N/A  . Number of Children: N/A  . Years  of Education: N/A   Occupational History  . Not on file.   Social History Main Topics  . Smoking status: Never Smoker   . Smokeless tobacco: Never Used  . Alcohol Use: No  . Drug Use: No  . Sexual Activity: Not on file   Other Topics Concern  . Not on file   Social History Narrative    Family History  Problem Relation Age of Onset  . Prostate cancer    . Hypertension      PHYSICAL EXAM: Filed Vitals:   10/30/15 1000 10/30/15 1010  BP: 95/71 103/65  Pulse:  83  Temp: 100.4 F (38 C) 100.4 F (38 C)  Resp: 15 18     Intake/Output Summary (Last 24 hours) at 10/30/15 1226 Last data filed at 10/30/15 1100  Gross per 24 hour  Intake   2020 ml  Output   1000 ml  Net   1020 ml    General:  Well appearing. No respiratory difficulty HEENT: normal Neck: supple. no JVD.  Carotids 2+ bilat; no bruits. No lymphadenopathy or thryomegaly appreciated. Cor: PMI nondisplaced. Regular rate & rhythm. No rubs, gallops or murmurs. Lungs: clear Abdomen: soft, nontender, nondistended. No hepatosplenomegaly. No bruits or masses. Good bowel sounds. Extremities: no cyanosis, clubbing, rash, edema Neuro: alert & oriented x 3, cranial nerves grossly intact. moves all 4 extremities w/o difficulty. Affect pleasant.  [id number] tachycardia with nonspecific ST-T changes  Results for orders placed or performed during the hospital encounter of 10/28/15 (from the past 24 hour(s))  Potassium     Status: Abnormal   Collection Time: 10/29/15  6:33 PM  Result Value Ref Range   Potassium 3.1 (L) 3.5 - 5.1 mmol/L  Basic metabolic panel     Status: Abnormal   Collection Time: 10/30/15  1:47 AM  Result Value Ref Range   Sodium 138 135 - 145 mmol/L   Potassium 4.5 3.5 - 5.1 mmol/L   Chloride 106 101 - 111 mmol/L   CO2 24 22 - 32 mmol/L   Glucose, Bld 85 65 - 99 mg/dL   BUN 11 6 - 20 mg/dL   Creatinine, Ser 0.49 0.44 - 1.00 mg/dL   Calcium 7.8 (L) 8.9 - 10.3 mg/dL   GFR calc non Af Amer >60 >60 mL/min   GFR calc Af Amer >60 >60 mL/min   Anion gap 8 5 - 15  Phosphorus     Status: Abnormal   Collection Time: 10/30/15  1:47 AM  Result Value Ref Range   Phosphorus 2.1 (L) 2.5 - 4.6 mg/dL  Magnesium     Status: None   Collection Time: 10/30/15  1:47 AM  Result Value Ref Range   Magnesium 1.8 1.7 - 2.4 mg/dL  CBC with Differential/Platelet     Status: Abnormal   Collection Time: 10/30/15  1:47 AM  Result Value Ref Range   WBC 14.0 (H) 3.6 - 11.0 K/uL   RBC 2.67 (L) 3.80 - 5.20 MIL/uL   Hemoglobin 7.0 (L) 12.0 - 16.0 g/dL   HCT 22.5 (L) 35.0 - 47.0 %   MCV 84.2 80.0 - 100.0 fL   MCH 26.0 26.0 - 34.0 pg   MCHC 30.9 (L) 32.0 - 36.0 g/dL   RDW 19.1 (H) 11.5 - 14.5 %   Platelets 256 150 - 440 K/uL   Neutrophils Relative % 86 %   Lymphocytes Relative 4 %   Monocytes Relative 10 %    Eosinophils Relative 0 %   Basophils  Relative 0 %   Neutro Abs 12.0 (H) 1.4 - 6.5 K/uL   Lymphs Abs 0.6 (L) 1.0 - 3.6 K/uL   Monocytes Absolute 1.4 (H) 0.2 - 0.9 K/uL   Eosinophils Absolute 0.0 0 - 0.7 K/uL   Basophils Absolute 0.0 0 - 0.1 K/uL  ABO/Rh     Status: None   Collection Time: 10/30/15 10:52 AM  Result Value Ref Range   ABO/RH(D) O POS   Type and screen [place]     Status: None (Preliminary result)   Collection Time: 10/30/15 10:53 AM  Result Value Ref Range   ABO/RH(D) O POS    Antibody Screen NEG    Sample Expiration 11/02/2015    Unit Number [id number]    Blood Component Type RBC LR PHER1    Unit division 00    Status of Unit ALLOCATED    Transfusion Status OK TO TRANSFUSE    Crossmatch Result Compatible   Prepare RBC     Status: None   Collection Time: 10/30/15 10:53 AM  Result Value Ref Range   Order Confirmation ORDER PROCESSED BY BLOOD BANK    Dg Chest 1 View  10/29/2015  CLINICAL DATA:  Sepsis, metastatic disease to the right lung EXAM: CHEST 1 VIEW COMPARISON:  Portable chest x-ray of October 28, 2015 FINDINGS: There remains volume loss on the right with large pleural effusion. The left lung is well-expanded and exhibits stable linear density in its midportion new since 2015 but not greatly changed since Sep 30, 2015. The left heart border is normal. The right heart border is obscured. The mediastinum is mildly widened though stable. The bony thorax exhibits no acute abnormality. IMPRESSION: Slight interval increase in the volume of pleural fluid on the right. Underlying atelectasis or consolidation of the right middle and lower lobes is suspected. No pulmonary edema. Stable atelectasis or scarring in the left mid lung. Electronically Signed   By: David  Martinique M.D.   On: 10/29/2015 07:28   Dg Thoracic Spine 2 View  10/28/2015  CLINICAL DATA:  Dorsalgia.  History of lung carcinoma EXAM: THORACIC SPINE 3 VIEWS COMPARISON:  August 13, 2014  thoracic spine radiographs and chest CT with bony reformats Sep 30, 2015 FINDINGS: Frontal, lateral, and swimmer's views were obtained. There is no fracture or spondylolisthesis. A sclerotic focus in the T8 vertebral body is better appreciated by CT but is present on this study. There is mild disc space narrowing at multiple levels. No erosive change or paraspinous lesion. An esophageal stent is present. IMPRESSION: Sclerotic focus in the T8 vertebral body concerning for a sclerotic metastasis is present, better seen on recent CT. No new bone lesion evident. Mild disc space narrowing at several levels. No fracture or spondylolisthesis. Electronically Signed   By: Lowella Grip III M.D.   On: 10/28/2015 18:39   Dg Lumbar Spine 2-3 Views  10/28/2015  CLINICAL DATA:  Lumbago.  History of lung carcinoma EXAM: LUMBAR SPINE - 2-3 VIEW COMPARISON:  Lumbar MRI March 25, 2014; CT abdomen pelvis with bony reformats Oct 09, 2014 FINDINGS: Frontal, lateral, and spot lumbosacral lateral images were obtained. There are 5 non-rib-bearing lumbar type vertebral bodies. There is no fracture or spondylolisthesis. There is mild disc space narrowing at L5-S1. Other disc spaces appear normal. No blastic or lytic bone lesions identified. No erosive change. There are multiple calcified leiomyomas noted in the pelvis. IMPRESSION: Scoliosis. Mild osteoarthritic change at L5-S1. No fracture or spondylolisthesis. No blastic or lytic  lesions evident. Uterine leiomyomas, calcified, in the pelvis. Electronically Signed   By: Lowella Grip III M.D.   On: 10/28/2015 18:41   Dg Chest Port 1 View  10/29/2015  CLINICAL DATA:  Sepsis EXAM: PORTABLE CHEST 1 VIEW COMPARISON:  09/30/2015 FINDINGS: Moderate to large right pleural effusion that is complex, non layering. There is underlying atelectasis and/or pneumonia. Linear opacity in the left mid lung is chronic and likely scar. Esophageal metallic stent and neighboring plastic stents which  by abdominal CT extend into a right paraesophageal collection. No pneumothorax or edema. Nonspecific mediastinal widening, accentuated by rotation. Lung sutures on the right. IMPRESSION: Large complex right pleural effusion with underlying atelectasis or pneumonia, new from exam 1 month prior but similar findings reported on chest CT from [facility] 10/07/2015. 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 Electronically Signed   By: Monte Fantasia M.D.   On: 10/29/2015 00:08   Ct Renal Stone Study  10/28/2015  CLINICAL DATA:  65 year old female with acute or chronic back pain. Patient has known lung cancer. EXAM: CT ABDOMEN AND PELVIS WITHOUT CONTRAST TECHNIQUE: Multidetector CT imaging of the abdomen and pelvis was performed following the standard protocol without IV contrast. COMPARISON:  CT dated 10/09/2014 FINDINGS: Evaluation of this exam is limited in the absence of intravenous contrast. There are postsurgical changes of the visualized portions of the right lung with areas of consolidation. There is a 2.8 x 3.6 cm paramediastinal collection at the right lung base. A pigtail drainage catheter extends from this collection into the esophagus and terminates within the stomach. There is partially visualized loculated appearing right-sided pleural effusion. There is a 5 mm nodule in the right lung base anteriorly. A 9 mm nodule in the left upper lobe/lingula has increased in size compared to the prior study (measured approximately 6 mm on the CT dated 10/09/2014). No intra-abdominal free air or free fluid. There is a 1.3 cm hypodense lesion in the left lobe of the liver similar to prior study of which is not well characterized on this study but may represent a cyst or hemangioma. The gallbladder, pancreas appear unremarkable. Small scattered calcified splenic granuloma. The adrenal glands appear unremarkable. Multiple small nonobstructing bilateral renal calculi measuring up to  3 mm in the upper pole of the right kidney. There is no hydronephrosis on either side. The visualized ureters and the urinary bladder appear unremarkable. There multiple fibroids within the uterus cell which are partially calcified. An esophageal stent is partially visualized. A there is no evidence of bowel obstruction or active inflammation. Normal appendix. The abdominal aorta and IVC are grossly unremarkable on this noncontrast study. No portal venous gas identified. There is no adenopathy. A 5 mm nodular density noted in the superficial subcutaneous soft tissues of the right anterior pelvic wall, nonspecific. There is a 1 cm nodular density in the subcutaneous soft tissues of the left anterior pelvic wall (series 2, image 70). The abdominal wall soft tissues appear unremarkable. There is stable sclerotic focus in the S3 vertebrae as well as a stable focus of sclerosis in the superior aspect of the T8 vertebra. This findings are nonspecific. IMPRESSION: Small nonobstructing bilateral renal calculi.  No hydronephrosis. Postsurgical changes of the right lung base with an area of consolidative/infiltrative change and partially visualized right pleural effusion. Focal area of collection at the right lung base. There is a drainage catheter extending from this collection into the stomach. Bilateral pulmonary nodules appear increased in size compared to prior  study. Electronically Signed   By: Anner Crete M.D.   On: 10/28/2015 22:28     ASSESSMENT AND PLAN: Possible sepsis with history of cancer of the lung. Patient has sinus tachycardia on the monitor as well as the EKG. This may be related to sepsis. Advising increasing the beta blocker and getting echocardiogram to evaluate ejection fraction.  KHAN,SHAUKAT A

## 2015-10-31 ENCOUNTER — Inpatient Hospital Stay
Admit: 2015-10-31 | Discharge: 2015-10-31 | Disposition: A | Discharge status: Home / Self Care | Payer: Medicare HMO | Attending: Cardiovascular Disease | Admitting: Cardiovascular Disease

## 2015-10-31 LAB — TYPE AND SCREEN
ABO/RH(D): O POS
ANTIBODY SCREEN: NEGATIVE
UNIT DIVISION: 0

## 2015-10-31 LAB — BASIC METABOLIC PANEL
ANION GAP: 9 (ref 5–15)
BUN: 8 mg/dL (ref 6–20)
CALCIUM: 8 mg/dL — AB (ref 8.9–10.3)
CHLORIDE: 100 mmol/L — AB (ref 101–111)
CO2: 26 mmol/L (ref 22–32)
CREATININE: 0.42 mg/dL — AB (ref 0.44–1.00)
GFR calc Af Amer: >60 mL/min (ref >60)
GFR calc non Af Amer: >60 mL/min (ref >60)
GLUCOSE: 98 mg/dL (ref 65–99)
Potassium: 3.1 mmol/L — ABNORMAL LOW (ref 3.5–5.1)
Sodium: 135 mmol/L (ref 135–145)

## 2015-10-31 LAB — CBC
HEMATOCRIT: 29.1 % — AB (ref 35.0–47.0)
HEMOGLOBIN: 9.5 g/dL — AB (ref 12.0–16.0)
MCH: 26.9 pg (ref 26.0–34.0)
MCHC: 32.5 g/dL (ref 32.0–36.0)
MCV: 82.9 fL (ref 80.0–100.0)
Platelets: 264 10*3/uL (ref 150–440)
RBC: 3.52 MIL/uL — ABNORMAL LOW (ref 3.80–5.20)
RDW: 18.2 % — AB (ref 11.5–14.5)
WBC: 12.1 10*3/uL — ABNORMAL HIGH (ref 3.6–11.0)

## 2015-10-31 LAB — PROTIME-INR
INR: 1.29
Prothrombin Time: 16.2 seconds — ABNORMAL HIGH (ref 11.4–15.0)

## 2015-10-31 LAB — POTASSIUM: POTASSIUM: 3.2 mmol/L — AB (ref 3.5–5.1)

## 2015-10-31 LAB — PHOSPHORUS: Phosphorus: 2.5 mg/dL (ref 2.5–4.6)

## 2015-10-31 LAB — MAGNESIUM: Magnesium: 1.5 mg/dL — ABNORMAL LOW (ref 1.7–2.4)

## 2015-10-31 LAB — APTT: APTT: 41 s — AB (ref 24–36)

## 2015-10-31 MED ORDER — MAGNESIUM SULFATE 2 GM/50ML IV SOLN
2.0000 g | Freq: Once | INTRAVENOUS | Status: AC
Start: 2015-10-31 — End: 2015-10-31
  Administered 2015-10-31: 2 g via INTRAVENOUS
  Filled 2015-10-31 (×2): qty 50

## 2015-10-31 MED ORDER — DILTIAZEM HCL 60 MG PO TABS
90.0000 mg | ORAL_TABLET | Freq: Three times a day (TID) | ORAL | Status: DC
  Administered 2015-10-31: 90 mg via ORAL
  Filled 2015-10-31: qty 1

## 2015-10-31 MED ORDER — POTASSIUM CHLORIDE 10 MEQ/100ML IV SOLN
10.0000 meq | INTRAVENOUS | Status: AC
  Administered 2015-10-31 – 2015-11-01 (×4): 10 meq via INTRAVENOUS
  Filled 2015-10-31 (×4): qty 100

## 2015-10-31 MED ORDER — POTASSIUM CHLORIDE 10 MEQ/100ML IV SOLN
10.0000 meq | INTRAVENOUS | Status: AC
  Administered 2015-10-31 (×3): 10 meq via INTRAVENOUS
  Filled 2015-10-31 (×4): qty 100

## 2015-10-31 MED ORDER — MORPHINE SULFATE (PF) 2 MG/ML IV SOLN
2.0000 mg | INTRAVENOUS | Status: DC | PRN
  Administered 2015-10-31 – 2015-11-01 (×3): 2 mg via INTRAVENOUS
  Filled 2015-10-31 (×3): qty 1

## 2015-10-31 MED ORDER — DILTIAZEM HCL 60 MG PO TABS
90.0000 mg | ORAL_TABLET | Freq: Three times a day (TID) | ORAL | Status: DC
  Administered 2015-10-31 – 2015-11-01 (×3): 90 mg via ORAL
  Filled 2015-10-31 (×3): qty 1

## 2015-10-31 NOTE — Progress Notes (Signed)
Patient daughter Wilburn Cornelia) cell # [phone]

## 2015-10-31 NOTE — Care Management (Signed)
A transfer to [facility] has been initiated. Referral has been accepted and it is thought that there are available beds.

## 2015-10-31 NOTE — Discharge Summary (Signed)
Theresa Mercado, 65 y.o., DOB August 05, 1950, MRN [id number]. 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 female who presents with Altered mental status to emergency room on June 4. Patient was also complaining of back pain. Patient was seen in the emergency room. And was noted to have abnormal UA. She had a CT per renal protocol. Which showed no hydronephrosis or pyelonephritis. Patient also was noted to have elevated lactic acid level. She was admitted for sepsis syndrome. And started on broad-spectrum antibiotics with vancomycin and Zosyn. Patient had a very elevated WBC count of 18,000. Patient urine culture showed no growth showed multiple contamination. She also had blood cultures which showed no growth. Due to her history of having mediastinal fistula she underwent a CT scan of the chest which showed worsening right-sided pleural effusion. Also nodules were increased in size. She has had a history of having external mediastinal drain as well as stenting in the esophagus by GI at [facility]. Patient has not shown much improvement at this point arrangements have been made to transfer her to [facility]. She also developed sinus tachycardia subsequently followed by atrial fibrillation with rapid ventricular rate. She was started on a Cardizem drip heart rate is improved she was seen by cardiology. She is transitioned to oral Cardizem for now. Patient's hemoglobin  also drifted down to 7 therefore she was transfused 1 unit of packed RBCs. Iron level is noted to be less than 5 but her ferritin is elevated likely related to inflammation. Hemoglobin is currently stable              Consults  cardiology, case discussed with pulmonary  Significant Tests:  See full reports for all details      Dg Chest 1 View  10/29/2015  CLINICAL DATA:  Sepsis, metastatic disease to the right lung EXAM: CHEST 1 VIEW COMPARISON:  Portable chest x-ray of October 28, 2015 FINDINGS: There remains volume loss on the right with large pleural effusion. The left lung is well-expanded and exhibits stable linear density in its midportion new since 2015 but not greatly changed since Sep 30, 2015. The left heart border is normal. The right heart border is obscured. The mediastinum is mildly widened though stable. The bony thorax exhibits no acute abnormality. IMPRESSION: Slight interval increase in the volume of pleural fluid on the right. Underlying atelectasis or consolidation of the right middle and lower lobes is suspected. No pulmonary edema. Stable atelectasis or scarring in the left mid lung. Electronically Signed   By: David  Martinique M.D.   On: 10/29/2015 07:28   Dg Thoracic Spine 2 View  10/28/2015  CLINICAL DATA:  Dorsalgia.  History of lung carcinoma EXAM: THORACIC SPINE 3 VIEWS COMPARISON:  August 13, 2014 thoracic spine radiographs and chest CT with bony reformats Sep 30, 2015 FINDINGS: Frontal, lateral, and swimmer's views were obtained. There is no fracture or spondylolisthesis. A sclerotic focus in the T8 vertebral body is better appreciated by CT but is present on this study. There  is mild disc space narrowing at multiple levels. No erosive change or paraspinous lesion. An esophageal stent is present. IMPRESSION: Sclerotic focus in the T8 vertebral body concerning for a sclerotic metastasis is present, better seen on recent CT. No new bone lesion evident. Mild disc space narrowing  at several levels. No fracture or spondylolisthesis. Electronically Signed   By: Lowella Grip III M.D.   On: 10/28/2015 18:39   Dg Lumbar Spine 2-3 Views  10/28/2015  CLINICAL DATA:  Lumbago.  History of lung carcinoma EXAM: LUMBAR SPINE - 2-3 VIEW COMPARISON:  Lumbar MRI March 25, 2014; CT abdomen pelvis with bony reformats Oct 09, 2014 FINDINGS: Frontal, lateral, and spot lumbosacral lateral images were obtained. There are 5 non-rib-bearing lumbar type vertebral bodies. There is no fracture or spondylolisthesis. There is mild disc space narrowing at L5-S1. Other disc spaces appear normal. No blastic or lytic bone lesions identified. No erosive change. There are multiple calcified leiomyomas noted in the pelvis. IMPRESSION: Scoliosis. Mild osteoarthritic change at L5-S1. No fracture or spondylolisthesis. No blastic or lytic lesions evident. Uterine leiomyomas, calcified, in the pelvis. Electronically Signed   By: Lowella Grip III M.D.   On: 10/28/2015 18:41   Ct Angio Chest Pe W/cm &/or Wo Cm  10/30/2015  CLINICAL DATA:  Persistent back and chest pain, history of prior esophageal stenting EXAM: CT ANGIOGRAPHY CHEST WITH CONTRAST TECHNIQUE: Multidetector CT imaging of the chest was performed using the standard protocol during bolus administration of intravenous contrast. Multiplanar CT image reconstructions and MIPs were obtained to evaluate the vascular anatomy. CONTRAST:  100 mL Isovue 370. COMPARISON:  CT from 09/30/2015 FINDINGS: The small left pleural effusion is noted. Mild left lower lobe atelectatic changes are seen. There are multiple stable nodules identified within the left lung similar to those noted on the prior examination. The right lung is nearly completely collapsed with only a small amount of each of the right lung lobes aerated. The pleural effusion on the right has significantly enlarged now occupying almost the entire hemi thorax. The previously seen right-sided nodules are not  well appreciated due to the significant consolidation within the right lung. There are changes consistent with esophageal stenting. Some fluid is noted within the esophagus as well as the stent. Multiple small double pigtail plastic stents are noted within the distal esophagus in the area of esophageal breakdown. A persistent air collection is noted to the right of the esophagus and extending into the mediastinum and likely pleural space on the right. This has improved in the interval from the prior exam with significant reduction in the air-fluid collection identified. The thoracic inlet shows no focal abnormality. The thoracic aorta and its branches are within normal limits. No extravasation is identified. The pulmonary artery demonstrates a normal branching pattern. No filling defects to suggest pulmonary emboli are identified. Cardiac structures are within normal limits. The visualized upper abdomen shows fatty infiltration of the liver. Bony structures again show sclerotic focus in the T8 vertebral body. No other bony abnormality is noted. Review of the MIP images confirms the above findings. IMPRESSION: Significant increase in the right-sided pleural effusion when compared with the prior exam. There is near complete collapse of the right lung with only mild aeration identified. No evidence of pulmonary emboli. Changes of esophageal stenting with both the metal stent as well as to plastic double pigtail stents. These traverse the previously seen esophageal defect. Persistent air-fluid is noted to the right of the esophagus but significantly improved in  size when compared with the prior exam. Small left pleural effusion. Stable left pulmonary nodules. The right pulmonary nodules are not well appreciated due to the near complete collapse of the right lung. Electronically Signed   By: Inez Catalina M.D.   On: 10/30/2015 16:56   Dg Chest Port 1 View  10/29/2015  CLINICAL DATA:  Sepsis EXAM: PORTABLE CHEST 1 VIEW  COMPARISON:  09/30/2015 FINDINGS: Moderate to large right pleural effusion that is complex, non layering. There is underlying atelectasis and/or pneumonia. Linear opacity in the left mid lung is chronic and likely scar. Esophageal metallic stent and neighboring plastic stents which by abdominal CT extend into a right paraesophageal collection. No pneumothorax or edema. Nonspecific mediastinal widening, accentuated by rotation. Lung sutures on the right. IMPRESSION: Large complex right pleural effusion with underlying atelectasis or pneumonia, new from exam 1 month prior but similar findings reported on chest CT from [facility] 10/07/2015. 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 Electronically Signed   By: Monte Fantasia M.D.   On: 10/29/2015 00:08   Ct Renal Stone Study  10/28/2015  CLINICAL DATA:  65 year old female with acute or chronic back pain. Patient has known lung cancer. EXAM: CT ABDOMEN AND PELVIS WITHOUT CONTRAST TECHNIQUE: Multidetector CT imaging of the abdomen and pelvis was performed following the standard protocol without IV contrast. COMPARISON:  CT dated 10/09/2014 FINDINGS: Evaluation of this exam is limited in the absence of intravenous contrast. There are postsurgical changes of the visualized portions of the right lung with areas of consolidation. There is a 2.8 x 3.6 cm paramediastinal collection at the right lung base. A pigtail drainage catheter extends from this collection into the esophagus and terminates within the stomach. There is partially visualized loculated appearing right-sided pleural effusion. There is a 5 mm nodule in the right lung base anteriorly. A 9 mm nodule in the left upper lobe/lingula has increased in size compared to the prior study (measured approximately 6 mm on the CT dated 10/09/2014). No intra-abdominal free air or free fluid. There is a 1.3 cm hypodense lesion in the left lobe of the liver similar to prior study of which  is not well characterized on this study but may represent a cyst or hemangioma. The gallbladder, pancreas appear unremarkable. Small scattered calcified splenic granuloma. The adrenal glands appear unremarkable. Multiple small nonobstructing bilateral renal calculi measuring up to 3 mm in the upper pole of the right kidney. There is no hydronephrosis on either side. The visualized ureters and the urinary bladder appear unremarkable. There multiple fibroids within the uterus cell which are partially calcified. An esophageal stent is partially visualized. A there is no evidence of bowel obstruction or active inflammation. Normal appendix. The abdominal aorta and IVC are grossly unremarkable on this noncontrast study. No portal venous gas identified. There is no adenopathy. A 5 mm nodular density noted in the superficial subcutaneous soft tissues of the right anterior pelvic wall, nonspecific. There is a 1 cm nodular density in the subcutaneous soft tissues of the left anterior pelvic wall (series 2, image 70). The abdominal wall soft tissues appear unremarkable. There is stable sclerotic focus in the S3 vertebrae as well as a stable focus of sclerosis in the superior aspect of the T8 vertebra. This findings are nonspecific. IMPRESSION: Small nonobstructing bilateral renal calculi.  No hydronephrosis. Postsurgical changes of the right lung base with an area of consolidative/infiltrative change and partially visualized right pleural effusion. Focal area of collection at the right  lung base. There is a drainage catheter extending from this collection into the stomach. Bilateral pulmonary nodules appear increased in size compared to prior study. Electronically Signed   By: Anner Crete M.D.   On: 10/28/2015 22:28       Today   Subjective:   Zellah Blansett  denies any back pain or chest pain this morning  Objective:   Blood pressure 106/59, pulse 79, temperature 99.5 F (37.5 C), temperature source Core  (Comment), resp. rate 13, height 5\' 4"  (1.626 m), weight 67.223 kg (148 lb 3.2 oz), SpO2 92 %.  .  Intake/Output Summary (Last 24 hours) at 10/31/15 0854 Last data filed at 10/31/15 0600  Gross per 24 hour  Intake 2271.51 ml  Output   1500 ml  Net 771.51 ml    Exam VITAL SIGNS: Blood pressure 106/59, pulse 79, temperature 99.5 F (37.5 C), temperature source Core (Comment), resp. rate 13, height 5\' 4"  (1.626 m), weight 67.223 kg (148 lb 3.2 oz), SpO2 92 %.  GENERAL:  65 y.o.-year-old patient lying in the bed with no acute distress.  EYES: Pupils equal, round, reactive to light and accommodation. No scleral icterus. Extraocular muscles intact.  HEENT: Head atraumatic, normocephalic. Oropharynx and nasopharynx clear.  NECK:  Supple, no jugular venous distention. No thyroid enlargement, no tenderness.  LUNGS:Diminished breath sound at the right lung base no wheezing or rhonchi  CARDIOVASCULAR:Irregularly irregular heart rhythm  normal. No murmurs, rubs, or gallops.  ABDOMEN: Soft, nontender, nondistended. Bowel sounds present. No organomegaly or mass.  EXTREMITIES: No pedal edema, cyanosis, or clubbing.  NEUROLOGIC: Cranial nerves II through XII are intact. Muscle strength 5/5 in all extremities. Sensation intact. Gait not checked.  PSYCHIATRIC: The patient is alert and oriented x 3.  SKIN: No obvious rash, lesion, or ulcer.   Data Review     CBC w Diff: Lab Results  Component Value Date   WBC 12.1* 10/31/2015   WBC 6.4 04/10/2014   HGB 9.5* 10/31/2015   HGB 10.6* 04/10/2014   HCT 29.1* 10/31/2015   HCT 30.1* 04/10/2014   PLT 264 10/31/2015   PLT 160 04/10/2014   LYMPHOPCT 4 10/30/2015   LYMPHOPCT 15.0 04/10/2014   MONOPCT 10 10/30/2015   MONOPCT 11.4 04/10/2014   EOSPCT 0 10/30/2015   EOSPCT 0.0 04/10/2014   BASOPCT 0 10/30/2015   BASOPCT 0.2 04/10/2014   CMP: Lab Results  Component Value Date   NA 135 10/31/2015   NA 145 04/10/2014   K 3.1* 10/31/2015   K 3.5  04/10/2014   CL 100* 10/31/2015   CL 109* 04/10/2014   CO2 26 10/31/2015   CO2 28 04/10/2014   BUN 8 10/31/2015   BUN 6* 04/10/2014   CREATININE 0.42* 10/31/2015   CREATININE 0.76 04/10/2014   PROT 6.2* 10/28/2015   PROT 7.7 04/02/2014   ALBUMIN 1.9* 10/28/2015   ALBUMIN 3.9 04/02/2014   BILITOT 0.4 10/28/2015   BILITOT 0.5 04/02/2014   ALKPHOS 94 10/28/2015   ALKPHOS 80 04/02/2014   AST 35 10/28/2015   AST 19 04/02/2014   ALT 22 10/28/2015   ALT 32 04/02/2014  .  Micro Results Recent Results (from the past 240 hour(s))  Urine culture     Status: Abnormal   Collection Time: 10/28/15  9:15 PM  Result Value Ref Range Status   Specimen Description URINE, CLEAN CATCH  Final   Special Requests NONE  Final   Culture MULTIPLE SPECIES PRESENT, SUGGEST RECOLLECTION (A)  Final  Report Status 10/30/2015 FINAL  Final  Blood Culture (routine x 2)     Status: None (Preliminary result)   Collection Time: 10/29/15 12:00 AM  Result Value Ref Range Status   Specimen Description BLOOD LEFT ANTECUBITAL  Final   Special Requests BOTTLES DRAWN AEROBIC AND ANAEROBIC [place]  Final   Culture  Setup Time   Final    GRAM POSITIVE COCCI ANAEROBIC BOTTLE ONLY CRITICAL RESULT CALLED TO, READ BACK BY AND VERIFIED WITH: Pickett ON 10/29/15 AT 2137 [facility] CONFIRMED BY DV Performed at [facility]    Culture   Final    [place] IN BOTH AEROBIC AND ANAEROBIC BOTTLES IDENTIFICATION TO FOLLOW    Report Status PENDING  Incomplete  Blood Culture (routine x 2)     Status: None (Preliminary result)   Collection Time: 10/29/15 12:00 AM  Result Value Ref Range Status   Specimen Description BLOOD LEFT ASSIST CONTROL  Final   Special Requests BOTTLES DRAWN AEROBIC AND ANAEROBIC [place]  Final   Culture NO GROWTH 1 DAY  Final   Report Status PENDING  Incomplete  Blood Culture ID Panel (Reflexed)     Status: Abnormal   Collection Time: 10/29/15 12:00 AM  Result Value Ref  Range Status   Enterococcus species NOT DETECTED NOT DETECTED Final   Vancomycin resistance NOT DETECTED NOT DETECTED Final   Listeria monocytogenes NOT DETECTED NOT DETECTED Final   Staphylococcus species NOT DETECTED NOT DETECTED Final   Staphylococcus aureus NOT DETECTED NOT DETECTED Final   Methicillin resistance NOT DETECTED NOT DETECTED Final   Streptococcus species DETECTED (A) NOT DETECTED Final    Comment: CRITICAL RESULT CALLED TO, READ BACK BY AND VERIFIED WITH: NATE COOKSON ON 10/29/15 AT 2137 [facility]    Streptococcus agalactiae NOT DETECTED NOT DETECTED Final   Streptococcus pneumoniae NOT DETECTED NOT DETECTED Final   Streptococcus pyogenes NOT DETECTED NOT DETECTED Final   Acinetobacter baumannii NOT DETECTED NOT DETECTED Final   Enterobacteriaceae species NOT DETECTED NOT DETECTED Final   Enterobacter cloacae complex NOT DETECTED NOT DETECTED Final   Escherichia coli NOT DETECTED NOT DETECTED Final   Klebsiella oxytoca NOT DETECTED NOT DETECTED Final   Klebsiella pneumoniae NOT DETECTED NOT DETECTED Final   Proteus species NOT DETECTED NOT DETECTED Final   Serratia marcescens NOT DETECTED NOT DETECTED Final   Carbapenem resistance NOT DETECTED NOT DETECTED Final   Haemophilus influenzae NOT DETECTED NOT DETECTED Final   Neisseria meningitidis NOT DETECTED NOT DETECTED Final   Pseudomonas aeruginosa NOT DETECTED NOT DETECTED Final   Candida albicans NOT DETECTED NOT DETECTED Final   Candida glabrata NOT DETECTED NOT DETECTED Final   Candida krusei NOT DETECTED NOT DETECTED Final   Candida parapsilosis NOT DETECTED NOT DETECTED Final   Candida tropicalis NOT DETECTED NOT DETECTED Final  MRSA PCR Screening     Status: None   Collection Time: 10/29/15  2:46 AM  Result Value Ref Range Status   MRSA by PCR NEGATIVE NEGATIVE Final    Comment:        The GeneXpert MRSA Assay (FDA approved for NASAL specimens only), is one component of a comprehensive MRSA  colonization surveillance program. It is not intended to diagnose MRSA infection nor to guide or monitor treatment for MRSA infections.         Code Status Orders        Start     Ordered   10/29/15 0248  Full code   Continuous  10/29/15 0247    Code Status History    Date Active Date Inactive Code Status Order ID Comments User Context   This patient has a current code status but no historical code status.            Discharge Medications     Medication List    ASK your doctor about these medications        acetaminophen 500 MG tablet  Commonly known as:  TYLENOL  Take 1,000 mg by mouth every 8 (eight) hours as needed for mild pain or headache.     cyclobenzaprine 5 MG tablet  Commonly known as:  FLEXERIL  Take 5-10 mg by mouth 2 (two) times daily as needed for muscle spasms.     ferrous sulfate 325 (65 FE) MG tablet  Take 325 mg by mouth 2 (two) times daily with a meal.     gabapentin 300 MG capsule  Commonly known as:  NEURONTIN  Take 600 mg by mouth 3 (three) times daily.     lidocaine 5 %  Commonly known as:  LIDODERM  Place 2 patches onto the skin daily. Remove & Discard patch within 12 hours or as directed by MD     lisinopril 5 MG tablet  Commonly known as:  PRINIVIL,ZESTRIL  Take 5 mg by mouth daily.     magnesium oxide 400 (241.3 Mg) MG tablet  Commonly known as:  MAG-OX  Take 800 mg by mouth 2 (two) times daily.     Melatonin 3 MG Tabs  Take 3 mg by mouth at bedtime.     omeprazole 20 MG capsule  Commonly known as:  PRILOSEC  Take 40 mg by mouth daily.     oxyCODONE-acetaminophen 5-325 MG tablet  Commonly known as:  PERCOCET/ROXICET  Take 1 tablet by mouth 2 (two) times daily as needed (for breakthrough pain).     senna 8.6 MG tablet  Commonly known as:  SENOKOT  Take 2 tablets by mouth 2 (two) times daily as needed for constipation.     traMADol 50 MG tablet  Commonly known as:  ULTRAM  Take 50 mg by mouth 3 (three)  times daily as needed for moderate pain.     traZODone 50 MG tablet  Commonly known as:  DESYREL  Take 50 mg by mouth at bedtime.           Total Time in preparing paper work, data evaluation and todays exam - 35 minutes  Dustin Flock M.D on 10/31/2015 at 8:54 AM  [facility]   Office  [phone]

## 2015-10-31 NOTE — Progress Notes (Signed)
Per Dr. Posey Pronto, patient is being transferred to [facility] for additional care, thoracentesis planned for here has been cancelled.Advised Dr. Stevenson Clinch of above, MD order to cancel previous lab orders he had placed to be done with thorcentesis.

## 2015-10-31 NOTE — Progress Notes (Signed)
Call to pharmacy to inquire on magnesium and potassium replacements, as patient is being transferred to [facility]. Per tech, drips are being processed/

## 2015-10-31 NOTE — Progress Notes (Addendum)
Pharmacy Consult for Electrolyte Monitoring Indication: Hypokalemia  Allergies  Allergen Reactions  . Fosaprepitant Shortness Of Breath and Other (See Comments)    Reaction:  Flushing    Patient Measurements: Height: 5\' 4"  (162.6 cm) Weight: 148 lb 3.2 oz (67.223 kg) IBW/kg (Calculated) : 54.7  Vital Signs: Temp: 99.5 F (37.5 C) (06/07 2000) Temp Source: Core (Comment) (06/07 2000) BP: 106/64 mmHg (06/07 2000) Pulse Rate: 72 (06/07 2000) Intake/Output from previous day: 06/06 0701 - 06/07 0700 In: 2661.5 [P.O.:200; I.V.:1807.8; Blood:348.7; IV Piggyback:305] Out: [id number] [Urine:1650] Intake/Output from this shift:    Labs:  Recent Labs  10/29/15 0732 10/30/15 0147 10/31/15 0456 10/31/15 0807  WBC 13.2* 14.0* 12.1*  --   HGB 7.6* 7.0* 9.5*  --   HCT 24.4* 22.5* 29.1*  --   PLT 287 256 264  --   APTT  --   --   --  41*  INR  --   --   --  1.29     Recent Labs  10/29/15 0732  10/30/15 0147 10/31/15 0456 10/31/15 2047  NA 138  --  138 135  --   K 2.9*  < > 4.5 3.1* 3.2*  CL 103  --  106 100*  --   CO2 28  --  24 26  --   GLUCOSE 98  --  85 98  --   BUN 12  --  11 8  --   CREATININE 0.39*  --  0.49 0.42*  --   CALCIUM 7.6*  --  7.8* 8.0*  --   MG 1.5*  --  1.8 1.5*  --   PHOS  --   --  2.1* 2.5  --   < > = values in this interval not displayed. Estimated Creatinine Clearance: 67 mL/min (by C-G formula based on Cr of 0.42).    Recent Labs  10/29/15 0241  GLUCAP 113*   Medical History: Past Medical History  Diagnosis Date  . Hypertension   . Back pain, chronic   . Arthritis   . Cancer of parotid gland ([place])     metastatic to lung   Medications:  Scheduled:  . diltiazem  90 mg Oral Q8H  . fentaNYL  12.5 mcg Transdermal Q72H  . metoprolol  2.5 mg Intravenous Q8H  . metoprolol succinate  50 mg Oral Daily  . piperacillin-tazobactam (ZOSYN)  IV  3.375 g Intravenous Q8H  . potassium chloride  10 mEq Intravenous Q1 Hr x 4  . sodium chloride  1,000  mL Intravenous Once   And  . sodium chloride  1,000 mL Intravenous Once  . sodium chloride flush  3 mL Intravenous Q12H   Infusions:  . sodium chloride 65 mL/hr at 10/31/15 1700    Assessment: Pharmacy consulted to assist in managing electrolytes in this 65 y/o F with sepsis/UTI.   K of 3.1 and Mg 1.5 are low this morning  Plan:  Order magnesium 2 g IV one time dose Order potassium 10 mEq IV x 4 runs  Will recheck potassium @ 1800 per protocol  6/7:  K @ 20:47 = 3.2  Will order KCl 10 mEq IV X 4 and recheck K on 6/8 with AM labs.   Robbins,Jason D 10/31/2015,9:28 PM  11/01/2015 0531 K 3.8, mag/phos not assessed. No indication for further supplement. Will recheck electrolytes tomorrow with AM labs. Indy Kuck A. [place], [place].D., BCPS, Clinical Pharmacist

## 2015-10-31 NOTE — Consult Note (Signed)
Consult placed for Right pleural effusion. Spoke with Dr. Dustin Flock, he stated that patient is being transferred to [facility], and he canceled the pulmonary consult.   Thank you for calling [facility].  Please feel free to contact us with any questions at [phone] (please enter 7-digits).    Vilinda Boehringer, MD [facility] Pager [phone] (please enter 7-digits) On Call Pager - [phone] (please enter 7-digits)

## 2015-10-31 NOTE — Progress Notes (Signed)
SUBJECTIVE: [name] is awake and pleasant this morning, although she is complaining of thirst as she is nothing by mouth for procedure. She denies any chest pain/pressure or shortness of breath.   Filed Vitals:   10/31/15 0300 10/31/15 0400 10/31/15 0500 10/31/15 0600  BP: 99/70 109/63 100/76 106/59  Pulse: 76 77 78 79  Temp: 99.3 F (37.4 C) 99.7 F (37.6 C) 99.3 F (37.4 C) 99.5 F (37.5 C)  TempSrc:      Resp:      Height:      Weight:      SpO2: 99% 99% 97% 92%    Intake/Output Summary (Last 24 hours) at 10/31/15 0836 Last data filed at 10/31/15 0600  Gross per 24 hour  Intake 2526.51 ml  Output   1500 ml  Net 1026.51 ml    LABS: Basic Metabolic Panel:  Recent Labs  10/30/15 0147 10/31/15 0456  NA 138 135  K 4.5 3.1*  CL 106 100*  CO2 24 26  GLUCOSE 85 98  BUN 11 8  CREATININE 0.49 0.42*  CALCIUM 7.8* 8.0*  MG 1.8 1.5*  PHOS 2.1* 2.5   Liver Function Tests:  Recent Labs  10/28/15 1931  AST 35  ALT 22  ALKPHOS 94  BILITOT 0.4  PROT 6.2*  ALBUMIN 1.9*   No results for input(s): LIPASE, AMYLASE in the last 72 hours. CBC:  Recent Labs  10/28/15 1931  10/30/15 0147 10/31/15 0456  WBC 18.0*  < > 14.0* 12.1*  NEUTROABS 16.6*  --  12.0*  --   HGB 8.4*  < > 7.0* 9.5*  HCT 25.9*  < > 22.5* 29.1*  MCV 82.4  < > 84.2 82.9  PLT 335  < > 256 264  < > = values in this interval not displayed. Cardiac Enzymes: No results for input(s): CKTOTAL, CKMB, CKMBINDEX, TROPONINI in the last 72 hours. BNP: Invalid input(s): POCBNP D-Dimer: No results for input(s): DDIMER in the last 72 hours. Hemoglobin A1C: No results for input(s): HGBA1C in the last 72 hours. Fasting Lipid Panel: No results for input(s): CHOL, HDL, LDLCALC, TRIG, CHOLHDL, LDLDIRECT in the last 72 hours. Thyroid Function Tests: No results for input(s): TSH, T4TOTAL, T3FREE, THYROIDAB in the last 72 hours.  Invalid input(s): FREET3 Anemia Panel:  Recent Labs  10/29/15 0732   VITAMINB12 575  FOLATE 5.3*  FERRITIN 877*  TIBC NOT CALCULATED  IRON <5*     PHYSICAL EXAM General: Well developed, well nourished, in no acute distress HEENT:  Normocephalic and atramatic Neck:  No JVD.  Lungs: Clear bilaterally to auscultation and percussion. Heart: HRRR . Normal S1 and S2 without gallops or murmurs.  Abdomen: Bowel sounds are positive, abdomen soft and non-tender  Msk:  Back normal, normal gait. Normal strength and tone for age. Extremities: No clubbing, cyanosis or edema.   Neuro: Alert and oriented X 3. Psych:  Good affect, responds appropriately  TELEMETRY: Normal sinus rhythm with rates in the 80s   ASSESSMENT AND PLAN: Upon review of telemetry noted a period of rapid atrial fibrillation from approximately 9:30 AM, and converting to normal sinus rhythm at 11:36 AM. She is now in a normal sinus rhythm with rates in the 80s. She should continue the beta blocker and will need anticoagulation once she is through any procedures. 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

## 2015-10-31 NOTE — Progress Notes (Signed)
*  PRELIMINARY RESULTS* Echocardiogram 2D Echocardiogram has been performed.  Theresa Mercado 10/31/2015, 9:36 AM

## 2015-10-31 NOTE — Progress Notes (Signed)
Pharmacy Consult for Electrolyte Monitoring Indication: Hypokalemia  Allergies  Allergen Reactions  . Fosaprepitant Shortness Of Breath and Other (See Comments)    Reaction:  Flushing    Patient Measurements: Height: 5\' 4"  (162.6 cm) Weight: 148 lb 3.2 oz (67.223 kg) IBW/kg (Calculated) : 54.7  Vital Signs: Temp: 99.5 F (37.5 C) (06/07 0600) BP: 106/59 mmHg (06/07 0600) Pulse Rate: 79 (06/07 0600) Intake/Output from previous day: 06/06 0701 - 06/07 0700 In: 2591.5 [P.O.:200; I.V.:1737.8; Blood:348.7; IV Piggyback:305] Out: 1650 [Urine:1650] Intake/Output from this shift:    Labs:  Recent Labs  10/29/15 0732 10/30/15 0147 10/31/15 0456  WBC 13.2* 14.0* 12.1*  HGB 7.6* 7.0* 9.5*  HCT 24.4* 22.5* 29.1*  PLT 287 256 264     Recent Labs  10/28/15 1931 10/29/15 0732 10/29/15 1833 10/30/15 0147 10/31/15 0456  NA 135 138  --  138 135  K 3.3* 2.9* 3.1* 4.5 3.1*  CL 94* 103  --  106 100*  CO2 30 28  --  24 26  GLUCOSE 122* 98  --  85 98  BUN 17 12  --  11 8  CREATININE 0.55 0.39*  --  0.49 0.42*  CALCIUM 8.4* 7.6*  --  7.8* 8.0*  MG  --  1.5*  --  1.8 1.5*  PHOS  --   --   --  2.1* 2.5  PROT 6.2*  --   --   --   --   ALBUMIN 1.9*  --   --   --   --   AST 35  --   --   --   --   ALT 22  --   --   --   --   ALKPHOS 94  --   --   --   --   BILITOT 0.4  --   --   --   --    Estimated Creatinine Clearance: 67 mL/min (by C-G formula based on Cr of 0.42).    Recent Labs  10/29/15 0241  GLUCAP 113*   Medical History: Past Medical History  Diagnosis Date  . Hypertension   . Back pain, chronic   . Arthritis   . 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 diltiazem  60 mg Oral Q8H  . fentaNYL  12.5 mcg Transdermal Q72H  . magnesium sulfate 1 - 4 g bolus IVPB  2 g Intravenous Once  . metoprolol  2.5 mg Intravenous Q8H  . metoprolol succinate  50 mg Oral Daily  . piperacillin-tazobactam (ZOSYN)  IV  3.375 g  Intravenous Q8H  . potassium chloride  10 mEq Intravenous Q1 Hr x 4  . sodium chloride  1,000 mL Intravenous Once   And  . sodium chloride  1,000 mL Intravenous Once  . sodium chloride flush  3 mL Intravenous Q12H   Infusions:  . sodium chloride 65 mL/hr at 10/30/15 0700  . diltiazem (CARDIZEM) infusion 5 mg/hr (10/31/15 0251)    Assessment: Pharmacy consulted to assist in managing electrolytes in this 65 y/o F with sepsis/UTI.   K of 3.1 and Mg 1.5 are low this morning  Plan:  Order magnesium 2 g IV one time dose Order potassium 10 mEq IV x 4 runs  Will recheck potassium @ 1800 per protocol  Theresa Mercado 10/31/2015,7:41 AM

## 2015-11-01 DIAGNOSIS — M546 Pain in thoracic spine: Secondary | ICD-10-CM

## 2015-11-01 DIAGNOSIS — C7800 Secondary malignant neoplasm of unspecified lung: Secondary | ICD-10-CM

## 2015-11-01 DIAGNOSIS — C7951 Secondary malignant neoplasm of bone: Secondary | ICD-10-CM

## 2015-11-01 DIAGNOSIS — A419 Sepsis, unspecified organism: Principal | ICD-10-CM

## 2015-11-01 DIAGNOSIS — Z515 Encounter for palliative care: Secondary | ICD-10-CM

## 2015-11-01 DIAGNOSIS — R000 Tachycardia, unspecified: Secondary | ICD-10-CM

## 2015-11-01 DIAGNOSIS — C07 Malignant neoplasm of parotid gland: Secondary | ICD-10-CM

## 2015-11-01 DIAGNOSIS — N390 Urinary tract infection, site not specified: Secondary | ICD-10-CM

## 2015-11-01 DIAGNOSIS — R4182 Altered mental status, unspecified: Secondary | ICD-10-CM

## 2015-11-01 DIAGNOSIS — M549 Dorsalgia, unspecified: Secondary | ICD-10-CM | POA: Insufficient documentation

## 2015-11-01 DIAGNOSIS — I10 Essential (primary) hypertension: Secondary | ICD-10-CM

## 2015-11-01 LAB — ECHOCARDIOGRAM COMPLETE
Height: 64 in
Weight: 2371.2 oz

## 2015-11-01 LAB — BASIC METABOLIC PANEL
ANION GAP: 7 (ref 5–15)
BUN: 6 mg/dL (ref 6–20)
CHLORIDE: 101 mmol/L (ref 101–111)
CO2: 26 mmol/L (ref 22–32)
Calcium: 8 mg/dL — ABNORMAL LOW (ref 8.9–10.3)
Creatinine, Ser: 0.42 mg/dL — ABNORMAL LOW (ref 0.44–1.00)
GFR calc Af Amer: >60 mL/min (ref >60)
GFR calc non Af Amer: >60 mL/min (ref >60)
GLUCOSE: 77 mg/dL (ref 65–99)
POTASSIUM: 3.8 mmol/L (ref 3.5–5.1)
Sodium: 134 mmol/L — ABNORMAL LOW (ref 135–145)

## 2015-11-01 MED ORDER — LIDOCAINE-EPINEPHRINE (PF) 1 %-1:200000 IJ SOLN
INTRAMUSCULAR | Status: AC
  Filled 2015-11-01: qty 30

## 2015-11-01 MED ORDER — FENTANYL CITRATE (PF) 100 MCG/2ML IJ SOLN
INTRAMUSCULAR | Status: AC
  Filled 2015-11-01: qty 2

## 2015-11-01 MED ORDER — HEPARIN (PORCINE) IN NACL 2-0.9 UNIT/ML-% IJ SOLN
INTRAMUSCULAR | Status: AC
  Filled 2015-11-01: qty 1000

## 2015-11-01 MED ORDER — ENOXAPARIN SODIUM 40 MG/0.4ML ~~LOC~~ SOLN
40.0000 mg | SUBCUTANEOUS | Status: DC
  Administered 2015-11-01: 40 mg via SUBCUTANEOUS
  Filled 2015-11-01: qty 0.4

## 2015-11-01 MED ORDER — MORPHINE SULFATE (PF) 4 MG/ML IV SOLN
4.0000 mg | INTRAVENOUS | Status: DC | PRN
  Administered 2015-11-01: 4 mg via INTRAVENOUS
  Filled 2015-11-01: qty 1

## 2015-11-01 MED ORDER — MORPHINE SULFATE ER 15 MG PO TBCR
15.0000 mg | EXTENDED_RELEASE_TABLET | Freq: Two times a day (BID) | ORAL | Status: DC
  Administered 2015-11-01: 15 mg via ORAL
  Filled 2015-11-01: qty 1

## 2015-11-01 NOTE — Consult Note (Signed)
Palliative Medicine Inpatient Consult Note   Name: ASTORIA CONDON Date: 11/01/2015 MRN: [id number]  DOB: 06-07-1950  Referring Physician: Demetrios Loll, MD  Palliative Care consult requested for this 65 y.o. female for goals of medical therapy in patient with sepsis.   DISCUSSIONS AND PLAN: I talked with pt and her sister and niece present. I very pleasantly introduced myself and brought up the fact that I am supposed to talk to her about one main subject and that is code status.   Pt says she 'doesn't like to think about code status'.  I told her I wold not keep talking about it, but I just wanted to know if she has thought about this or has an inclination about it.  She said she would rather 'talk about it with her family'.    Pt is about to be transferred to [facility] (we believe this to be the case as [facility] has just called the attending inquiring about pts status).  I would not want to escalate her pain regimen further given her full code status right now ---though I can see that she is not having her pain controlled. She is rubbing her back--right around the T8 area (where she likely has a met).  I mentioned that the cancer could be in her spine and they all reacted like this was 'news' --but I said it 'wasn't definite'.    At this time, I have made an attempt to discuss code status. There is much more to discuss, but I had hoped that by narrowing the topic we could get somewhere in discussion. She obviously finds this subject matter unpleasant. Her sister and niece seemed receptive --but NOT the pt.    I will sign off at this time ---but would STONGLY recommend a [place] --if not for goals of care, at least for symptom management.    (She does need someone reminding her that some advance directives either on paper or verbally are needed by her family and her care providers).    CLINICAL NARRATIVE: I am seeing pt / family briefly today b/c she is reportedly waiting to be  transferred to [facility] where all of her cancer care has been provided previously.  Pt was to have had a thoracentesis of right pleural effusion, but due to anticipated transfer, this was cancelled.  Labs related to this were cancelled.  Keith Cancio is a 65 yo woman who came in with AMS and severe back pain on June 4th.  She had an abnl UA and then a CT by renal stone protocol.. This showed nonobstructing stones bilaterally.  She had elevated lactic acid w/ a WBC of 18K and urine cx showed multiple organisms.  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  Lung nodulse were increased in size.  She has a h/o an external mediastinal drain (in her lungs) as well as stenting of the esophagus by GI at [facility].  She had not shown much improvement since admission here and Dr. Dustin Flock contacted [facility] about having pt transferred there.    She had sinus tach and then Afib RVR.  A cardizem drip was used and then [facility] and she is on oral Cardizem now per cardiology consult.  Her Hgb drifted down to 7 and she was transfused 1 unit of  PRBCs.  Iron level is quite low with Ferritin high from acute phase reaction.    Pt converted to NSR in am of 6/7.  Rate is in the 80's .  She continues on a beta blocker and will need anticoagulation once she is through getting procedures. She is not started on anticoagulation as she may be getting a thoracentesis soon.  Pt has been on Vanc and Zosyn for sepsis. She has been calling out often for pain meds with a number of meds tried w/o success.    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  SOCIAL HISTORY:  reports that she has never smoked. She has never used smokeless tobacco. She reports that she does not drink alcohol or use illicit drugs.  Daughter is [place] at cell [phone].  LEGAL DOCUMENTS:    CODE STATUS: Full code  PAST MEDICAL HISTORY: Past Medical History  Diagnosis Date  . Hypertension   . Back pain, chronic   . Arthritis   . 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 Salivary gland surgery      ALLERGIES:  is allergic to fosaprepitant.  MEDICATIONS:  Current Facility-Administered Medications  Medication Dose Route Frequency Provider Last Rate Last Dose  . 0.9 %  sodium chloride infusion   Intravenous Continuous Vilinda Boehringer, MD 65 mL/hr at 10/31/15 2330    . acetaminophen (TYLENOL) tablet 650 mg  650 mg Oral Q6H PRN Lance Coon, MD   650 mg at 10/31/15 1442   Or  . acetaminophen (TYLENOL) suppository 650 mg  650 mg Rectal Q6H PRN Lance Coon, MD      . diltiazem (CARDIZEM) injection 10 mg  10 mg Intravenous Q6H PRN Dustin Flock, MD   10 mg at 10/30/15 0954  . diltiazem (CARDIZEM) tablet 90 mg  90 mg Oral Q8H Dustin Flock, MD   90 mg at 11/01/15 1010  . enoxaparin (LOVENOX) injection 40 mg  40 mg Subcutaneous Q24H Vishal Mungal, MD   40 mg at 11/01/15 1149  . fentaNYL (DURAGESIC - dosed mcg/hr) 12.5 mcg  12.5 mcg Transdermal Q72H Harrie Foreman, MD   12.5 mcg at 11/01/15 0543  . HYDROmorphone (DILAUDID) injection 1 mg  1 mg Intravenous Q3H PRN Harrie Foreman, MD   1 mg at 11/01/15 1149  . LORazepam (ATIVAN) injection 1 mg  1 mg Intravenous Q4H PRN Harrie Foreman, MD   1 mg at 10/30/15  0023  . metoprolol (LOPRESSOR) injection 2.5 mg  2.5 mg Intravenous Q8H Dustin Flock, MD   2.5 mg at 11/01/15 0622  . metoprolol succinate (TOPROL-XL) 24 hr tablet 50 mg  50 mg Oral Daily Dionisio David, MD   50 mg at 11/01/15 1010  . morphine 2 MG/ML injection 2 mg  2 mg Intravenous Q4H PRN Vishal Mungal, MD   2 mg at 11/01/15 1010  . ondansetron (ZOFRAN) tablet 4 mg  4 mg Oral Q6H PRN Lance Coon, MD  Or  . ondansetron (ZOFRAN) injection 4 mg  4 mg Intravenous Q6H PRN Lance Coon, MD   4 mg at 10/29/15 0300  . oxyCODONE (Oxy IR/ROXICODONE) immediate release tablet 5 mg  5 mg Oral Q4H PRN Lance Coon, MD   5 mg at 11/01/15 4680  . piperacillin-tazobactam (ZOSYN) IVPB 3.375 g  3.375 g Intravenous [address] [place], RPH   3.375 g at 11/01/15 0544  . sodium chloride 0.9 % bolus 1,000 mL  1,000 mL Intravenous Once Daymon Larsen, MD   1,000 mL at 10/28/15 2356   And  . sodium chloride 0.9 % bolus 1,000 mL  1,000 mL Intravenous Once Daymon Larsen, MD   1,000 mL at 10/28/15 2357  . sodium chloride flush (NS) 0.9 % injection 3 mL  3 mL Intravenous Q12H Lance Coon, MD   3 mL at 10/31/15 2240    Vital Signs: BP 126/70 mmHg  Pulse 65  Temp(Src) 100.4 F (38 C) (Core (Comment))  Resp 14  Ht 5' 4"  (1.626 m)  Wt 67.223 kg (148 lb 3.2 oz)  BMI 25.43 kg/m2  SpO2 98% Filed Weights   10/28/15 1743  Weight: 67.223 kg (148 lb 3.2 oz)    Estimated body mass index is 25.43 kg/(m^2) as calculated from the following:   Height as of this encounter: 5' 4"  (1.626 m).   Weight as of this encounter: 67.223 kg (148 lb 3.2 oz).  PERFORMANCE STATUS (ECOG) : 4 - Bedbound  PHYSICAL EXAM: Has just gotten quiet after crying out for pain meds for 2-3 hours In ICU bed --but technically not now an ICU pt.  (awaiting transfer to [facility]). Lying on side --rubbing back EOMI Hrt rrr ' Lungs w/ decreased BS bilat Ext no cyanosis or mottling.  LABS: CBC:    Component Value Date/Time   WBC 12.1*  10/31/2015 0456   WBC 6.4 04/10/2014 0512   HGB 9.5* 10/31/2015 0456   HGB 10.6* 04/10/2014 0512   HCT 29.1* 10/31/2015 0456   HCT 30.1* 04/10/2014 0512   PLT 264 10/31/2015 0456   PLT 160 04/10/2014 0512   MCV 82.9 10/31/2015 0456   MCV 89 04/10/2014 0512   NEUTROABS 12.0* 10/30/2015 0147   NEUTROABS 4.7 04/10/2014 0512   LYMPHSABS 0.6* 10/30/2015 0147   LYMPHSABS 1.0 04/10/2014 0512   MONOABS 1.4* 10/30/2015 0147   MONOABS 0.7 04/10/2014 0512   EOSABS 0.0 10/30/2015 0147   EOSABS 0.0 04/10/2014 0512   BASOSABS 0.0 10/30/2015 0147   BASOSABS 0.0 04/10/2014 0512   Comprehensive Metabolic Panel:    Component Value Date/Time   NA 134* 11/01/2015 0531   NA 145 04/10/2014 0512   K 3.8 11/01/2015 0531   K 3.5 04/10/2014 0512   CL 101 11/01/2015 0531   CL 109* 04/10/2014 0512   CO2 26 11/01/2015 0531   CO2 28 04/10/2014 0512   BUN 6 11/01/2015 0531   BUN 6* 04/10/2014 0512   CREATININE 0.42* 11/01/2015 0531   CREATININE 0.76 04/10/2014 0512   GLUCOSE 77 11/01/2015 0531   GLUCOSE 116* 04/10/2014 0512   CALCIUM 8.0* 11/01/2015 0531   CALCIUM 8.6 04/10/2014 0512   AST 35 10/28/2015 1931   AST 19 04/02/2014 0428   ALT 22 10/28/2015 1931   ALT 32 04/02/2014 0428   ALKPHOS 94 10/28/2015 1931   ALKPHOS 80 04/02/2014 0428   BILITOT 0.4 10/28/2015 1931   BILITOT 0.5 04/02/2014 0428   PROT 6.2* 10/28/2015 1931   PROT  7.7 04/02/2014 0428   ALBUMIN 1.9* 10/28/2015 1931   ALBUMIN 3.9 04/02/2014 0428        CT renal stone 6/4: Small nonobstructing bilateral renal calculi. No hydronephrosis. Postsurgical changes of the right lung base with an area of consolidative/infiltrative change and partially visualized right pleural effusion. Focal area of collection at the right lung base. There is a drainage catheter extending from this collection into the Stomach  Bilateral pulmonary nodules appear increased in size compared to prior study.  CT Angio chest 6/6/: Significant  increase in the right-sided pleural effusion when compared with the prior exam. There is near complete collapse of the right lung with only mild aeration identified.No evidence of pulmonary emboli. Changes of esophageal stenting with both the metal stent as well as to plastic double pigtail stents. These traverse the previously seen esophageal defect. Persistent air-fluid is noted to the right of the esophagus but significantly improved in size when compared with the prior exam.Small left pleural effusion. Stable left pulmonary nodules. The right pulmonary nodules are not well appreciated due to the near complete collapse of the right Lung.  XR T spine 6/4: Sclerotic focus in the T8 vertebral body concerning for a sclerotic metastasis is present, better seen on recent CT. No new bone lesion evident. Mild disc space narrowing at several levels. No fracture or Spondylolisthesis.   ECHO 10/31/15: - Left ventricle: The cavity size was normal. Systolic function was  normal. The estimated ejection fraction was 65%. Doppler  parameters are consistent with abnormal left ventricular  relaxation (grade 1 diastolic dysfunction). - Aortic valve: Valve area (Vmax): 3.3 cm^2. - Atrial septum: No defect or patent foramen ovale was identified. - Normal left ventricle systolic function with mild diastolic  dysfunction and trace mitral regurgitation. More than 50% of the visit was spent in counseling/coordination of care: Yes  Time Spent: 80 minutes

## 2015-11-01 NOTE — Progress Notes (Signed)
Pharmacy Antibiotic Note  Theresa Mercado is a 65 y.o. female admitted on 10/28/2015 with sepsis/UTI.  Pharmacy has been consulted for vancomycin and Zosyn dosing. Vancomycin d/c 6/5.   Plan: Continue Zosyn 3.375 grams q 8 hours.   Height: 5\' 4"  (162.6 cm) Weight: 148 lb 3.2 oz (67.223 kg) IBW/kg (Calculated) : 54.7  Temp (24hrs), Avg:99.8 F (37.7 C), Min:99.1 F (37.3 C), Max:100.4 F (38 C)   Recent Labs Lab 10/28/15 1931 10/29/15 0732 10/30/15 0147 10/31/15 0456 11/01/15 0531  WBC 18.0* 13.2* 14.0* 12.1*  --   CREATININE 0.55 0.39* 0.49 0.42* 0.42*  LATICACIDVEN  --  1.8  --   --   --     Estimated Creatinine Clearance: 67 mL/min (by C-G formula based on Cr of 0.42).    Allergies  Allergen Reactions  . Fosaprepitant Shortness Of Breath and Other (See Comments)    Reaction:  Flushing     Antimicrobials this admission: vancomycin 6/5 >> 6/5 Zosyn  6/4 >>   Dose adjustments this admission:   Microbiology results: 6/5 BCx: Streptococcus species in 1/2 6/5 UCx: mx species 6/5 MRSA PCR: negative  Pharmacy will continue to monitor and adjust per consult.    Tishie Altmann L 11/01/2015 2:06 PM

## 2015-11-01 NOTE — Progress Notes (Addendum)
[place] at [facility]                                                                                                                                                                                            Patient Demographics   Theresa Mercado, is a 65 y.o. female, DOB - [birth year], [id number]  Admit date - 10/28/2015   Admitting Physician Lance Coon, MD  Outpatient Primary MD for the patient is [place]   LOS - 4  Subjective Patient with hr improved, denies any chest  Pain currently denying any shortness of breath Intermittent back pain   Review of Systems:   CONSTITUTIONAL: Positive fever. Positive fatigue, weakness. No weight gain, no weight loss.  EYES: No blurry or double vision.  ENT: No tinnitus. No postnasal drip. No redness of the oropharynx.  RESPIRATORY: No cough, no wheeze, no hemoptysis. no dyspnea.  CARDIOVASCULAR: Positive chest pain. No orthopnea. No palpitations. No syncope.  GASTROINTESTINAL: No nausea, no vomiting or diarrhea. Positive abdominal pain. No melena or hematochezia.  GENITOURINARY: No dysuria or hematuria.  ENDOCRINE: No polyuria or nocturia. No heat or cold intolerance.  HEMATOLOGY: No anemia. No bruising. No bleeding.  INTEGUMENTARY: No rashes. No lesions.  MUSCULOSKELETAL: No arthritis. No swelling. No gout. Positive back pain NEUROLOGIC: No numbness, tingling, or ataxia. No seizure-type activity.  PSYCHIATRIC: No anxiety. No insomnia. No ADD.    Vitals:   Filed Vitals:   11/01/15 1200 11/01/15 1300 11/01/15 1326 11/01/15 1400  BP: 121/70 110/79  120/74  Pulse: 84 88 87 85  Temp: 100.2 F (37.9 C) 100.4 F (38 C) 100.4 F (38 C) 100 F (37.8 C)  TempSrc:      Resp: 17 16 20 20   Height:      Weight:      SpO2: 96% 99% 88% 89%    Wt Readings from Last 3 Encounters:  10/28/15 67.223 kg (148 lb 3.2 oz)  09/30/15 73.392 kg (161 lb 12.8 oz)  07/14/15 79.833 kg (176 lb)      Intake/Output Summary (Last 24 hours) at 11/01/15 1512 Last data filed at 11/01/15 1400  Gross per 24 hour  Intake   1875 ml  Output    925 ml  Net    950 ml    Physical Exam:   GENERAL: Pleasant-appearing in no apparent distress.  HEAD, EYES, EARS, NOSE AND THROAT: Atraumatic, normocephalic. Extraocular muscles are intact. Pupils equal and reactive to light. Sclerae anicteric. No conjunctival injection. No oro-pharyngeal erythema.  NECK: Supple. There is no jugular venous distention. No bruits, no lymphadenopathy, no thyromegaly.  HEART: Irregularly irregular rhythm,. No murmurs, no rubs, no clicks.  LUNGS: Clear to auscultation bilaterally. No rales or rhonchi. No wheezes.  ABDOMEN: Soft, flat, nontender, nondistended. Has good bowel sounds. No hepatosplenomegaly appreciated.  EXTREMITIES: No evidence of any cyanosis, clubbing, or peripheral edema.  +2 pedal and radial pulses bilaterally.  NEUROLOGIC: The patient is alert, awake, and oriented x3 with no focal motor or sensory deficits appreciated bilaterally.  SKIN: Moist and warm with no rashes appreciated.  Psych: Not anxious, depressed LN: No inguinal LN enlargement    Antibiotics   Anti-infectives    Start     Dose/Rate Route Frequency Ordered Stop   10/29/15 1500  vancomycin (VANCOCIN) 1,250 mg in sodium chloride 0.9 % 250 mL IVPB  Status:  Discontinued     1,250 mg 166.7 mL/hr over 90 Minutes Intravenous Every 18 hours 10/29/15 0301 10/29/15 1048   10/29/15 0400  vancomycin (VANCOCIN) 1,250 mg in sodium chloride 0.9 % 250 mL IVPB     1,250 mg 166.7 mL/hr over 90 Minutes Intravenous  Once 10/29/15 0301 10/29/15 0515   10/29/15 0248  vancomycin (VANCOCIN) IVPB 1000 mg/200 mL premix  Status:  Discontinued     1,000 mg 200 mL/hr over 60 Minutes Intravenous Every 12 hours 10/29/15 0248 10/29/15 0301   10/29/15 0248  piperacillin-tazobactam (ZOSYN) IVPB 3.375 g     3.375 g 12.5 mL/hr over 240 Minutes Intravenous Every  8 hours 10/29/15 0248     10/28/15 2330  piperacillin-tazobactam (ZOSYN) IVPB 3.375 g     3.375 g 100 mL/hr over 30 Minutes Intravenous  Once 10/28/15 2318 10/29/15 0034      Medications   Scheduled Meds: . diltiazem  90 mg Oral Q8H  . enoxaparin (LOVENOX) injection  40 mg Subcutaneous Q24H  . fentaNYL  12.5 mcg Transdermal Q72H  . metoprolol  2.5 mg Intravenous Q8H  . metoprolol succinate  50 mg Oral Daily  . piperacillin-tazobactam (ZOSYN)  IV  3.375 g Intravenous Q8H  . sodium chloride  1,000 mL Intravenous Once   And  . sodium chloride  1,000 mL Intravenous Once  . sodium chloride flush  3 mL Intravenous Q12H   Continuous Infusions: . sodium chloride 65 mL/hr at 10/31/15 2330   PRN Meds:.acetaminophen **OR** acetaminophen, diltiazem, HYDROmorphone (DILAUDID) injection, LORazepam, morphine injection, ondansetron **OR** ondansetron (ZOFRAN) IV, oxyCODONE   Data Review:   Micro Results Recent Results (from the past 240 hour(s))  Urine culture     Status: Abnormal   Collection Time: 10/28/15  9:15 PM  Result Value Ref Range Status   Specimen Description URINE, CLEAN CATCH  Final   Special Requests NONE  Final   Culture MULTIPLE SPECIES PRESENT, SUGGEST RECOLLECTION (A)  Final   Report Status 10/30/2015 FINAL  Final  Blood Culture (routine x 2)     Status: Abnormal   Collection Time: 10/29/15 12:00 AM  Result Value Ref Range Status   Specimen Description BLOOD LEFT ANTECUBITAL  Final   Special Requests BOTTLES DRAWN AEROBIC AND ANAEROBIC [place]  Final   Culture  Setup Time   Final    GRAM POSITIVE COCCI IN BOTH AEROBIC AND ANAEROBIC BOTTLES CRITICAL RESULT CALLED TO, READ BACK BY AND VERIFIED WITH: [place] ON 10/29/15 AT [address] [facility] CONFIRMED BY DV    Culture (A)  Final    STREPTOCOCCUS ANGINOSIS ANAEROBIC BOTTLE ONLY STAPHYLOCOCCUS SPECIES (COAGULASE NEGATIVE) AEROBIC BOTTLE ONLY Results consistent with contamination. Performed at [facility]     Report  Status 11/01/2015 FINAL  Final  Blood Culture (routine x 2)     Status: None (Preliminary result)   Collection Time: 10/29/15 12:00 AM  Result Value Ref Range Status   Specimen Description BLOOD LEFT ANTECUBITAL  Final   Special Requests BOTTLES DRAWN AEROBIC AND ANAEROBIC [place]  Final   Culture   Final    NO GROWTH 3 DAYS Performed at [facility]    Report Status PENDING  Incomplete  Blood Culture ID Panel (Reflexed)     Status: Abnormal   Collection Time: 10/29/15 12:00 AM  Result Value Ref Range Status   Enterococcus species NOT DETECTED NOT DETECTED Final   Vancomycin resistance NOT DETECTED NOT DETECTED Final   Listeria monocytogenes NOT DETECTED NOT DETECTED Final   Staphylococcus species NOT DETECTED NOT DETECTED Final   Staphylococcus aureus NOT DETECTED NOT DETECTED Final   Methicillin resistance NOT DETECTED NOT DETECTED Final   Streptococcus species DETECTED (A) NOT DETECTED Final    Comment: CRITICAL RESULT CALLED TO, READ BACK BY AND VERIFIED WITH: NATE COOKSON ON 10/29/15 AT [address] [facility]    Streptococcus agalactiae NOT DETECTED NOT DETECTED Final   Streptococcus pneumoniae NOT DETECTED NOT DETECTED Final   Streptococcus pyogenes NOT DETECTED NOT DETECTED Final   Acinetobacter baumannii NOT DETECTED NOT DETECTED Final   Enterobacteriaceae species NOT DETECTED NOT DETECTED Final   Enterobacter cloacae complex NOT DETECTED NOT DETECTED Final   Escherichia coli NOT DETECTED NOT DETECTED Final   Klebsiella oxytoca NOT DETECTED NOT DETECTED Final   Klebsiella pneumoniae NOT DETECTED NOT DETECTED Final   Proteus species NOT DETECTED NOT DETECTED Final   Serratia marcescens NOT DETECTED NOT DETECTED Final   Carbapenem resistance NOT DETECTED NOT DETECTED Final   Haemophilus influenzae NOT DETECTED NOT DETECTED Final   Neisseria meningitidis NOT DETECTED NOT DETECTED Final   Pseudomonas aeruginosa NOT DETECTED NOT DETECTED Final   Candida albicans NOT  DETECTED NOT DETECTED Final   Candida glabrata NOT DETECTED NOT DETECTED Final   Candida krusei NOT DETECTED NOT DETECTED Final   Candida parapsilosis NOT DETECTED NOT DETECTED Final   Candida tropicalis NOT DETECTED NOT DETECTED Final  MRSA PCR Screening     Status: None   Collection Time: 10/29/15  2:46 AM  Result Value Ref Range Status   MRSA by PCR NEGATIVE NEGATIVE Final    Comment:        The GeneXpert MRSA Assay (FDA approved for NASAL specimens only), is one component of a comprehensive MRSA colonization surveillance program. It is not intended to diagnose MRSA infection nor to guide or monitor treatment for MRSA infections.     Radiology Reports Dg Chest 1 View  10/29/2015  CLINICAL DATA:  Sepsis, metastatic disease to the right lung EXAM: CHEST 1 VIEW COMPARISON:  Portable chest x-ray of October 28, 2015 FINDINGS: There remains volume loss on the right with large pleural effusion. The left lung is well-expanded and exhibits stable linear density in its midportion new since 2015 but not greatly changed since Sep 30, 2015. The left heart border is normal. The right heart border is obscured. The mediastinum is mildly widened though stable. The bony thorax exhibits no acute abnormality. IMPRESSION: Slight interval increase in the volume of pleural fluid on the right. Underlying atelectasis or consolidation of the right middle and lower lobes is suspected. No pulmonary edema. Stable atelectasis or scarring in the left mid lung. Electronically Signed   By: David  Martinique M.D.   On: 10/29/2015  07:28   Dg Thoracic Spine 2 View  10/28/2015  CLINICAL DATA:  Dorsalgia.  History of lung carcinoma EXAM: THORACIC SPINE 3 VIEWS COMPARISON:  August 13, 2014 thoracic spine radiographs and chest CT with bony reformats Sep 30, 2015 FINDINGS: Frontal, lateral, and swimmer's views were obtained. There is no fracture or spondylolisthesis. A sclerotic focus in the T8 vertebral body is better appreciated by CT  but is present on this study. There is mild disc space narrowing at multiple levels. No erosive change or paraspinous lesion. An esophageal stent is present. IMPRESSION: Sclerotic focus in the T8 vertebral body concerning for a sclerotic metastasis is present, better seen on recent CT. No new bone lesion evident. Mild disc space narrowing at several levels. No fracture or spondylolisthesis. Electronically Signed   By: Lowella Grip III M.D.   On: 10/28/2015 18:39   Dg Lumbar Spine 2-3 Views  10/28/2015  CLINICAL DATA:  Lumbago.  History of lung carcinoma EXAM: LUMBAR SPINE - 2-3 VIEW COMPARISON:  Lumbar MRI March 25, 2014; CT abdomen pelvis with bony reformats Oct 09, 2014 FINDINGS: Frontal, lateral, and spot lumbosacral lateral images were obtained. There are 5 non-rib-bearing lumbar type vertebral bodies. There is no fracture or spondylolisthesis. There is mild disc space narrowing at L5-S1. Other disc spaces appear normal. No blastic or lytic bone lesions identified. No erosive change. There are multiple calcified leiomyomas noted in the pelvis. IMPRESSION: Scoliosis. Mild osteoarthritic change at L5-S1. No fracture or spondylolisthesis. No blastic or lytic lesions evident. Uterine leiomyomas, calcified, in the pelvis. Electronically Signed   By: Lowella Grip III M.D.   On: 10/28/2015 18:41   Ct Angio Chest Pe W/cm &/or Wo Cm  10/30/2015  CLINICAL DATA:  Persistent back and chest pain, history of prior esophageal stenting EXAM: CT ANGIOGRAPHY CHEST WITH CONTRAST TECHNIQUE: Multidetector CT imaging of the chest was performed using the standard protocol during bolus administration of intravenous contrast. Multiplanar CT image reconstructions and MIPs were obtained to evaluate the vascular anatomy. CONTRAST:  100 mL Isovue 370. COMPARISON:  CT from 09/30/2015 FINDINGS: The small left pleural effusion is noted. Mild left lower lobe atelectatic changes are seen. There are multiple stable nodules  identified within the left lung similar to those noted on the prior examination. The right lung is nearly completely collapsed with only a small amount of each of the right lung lobes aerated. The pleural effusion on the right has significantly enlarged now occupying almost the entire hemi thorax. The previously seen right-sided nodules are not well appreciated due to the significant consolidation within the right lung. There are changes consistent with esophageal stenting. Some fluid is noted within the esophagus as well as the stent. Multiple small double pigtail plastic stents are noted within the distal esophagus in the area of esophageal breakdown. A persistent air collection is noted to the right of the esophagus and extending into the mediastinum and likely pleural space on the right. This has improved in the interval from the prior exam with significant reduction in the air-fluid collection identified. The thoracic inlet shows no focal abnormality. The thoracic aorta and its branches are within normal limits. No extravasation is identified. The pulmonary artery demonstrates a normal branching pattern. No filling defects to suggest pulmonary emboli are identified. Cardiac structures are within normal limits. The visualized upper abdomen shows fatty infiltration of the liver. Bony structures again show sclerotic focus in the T8 vertebral body. No other bony abnormality is noted. Review of the MIP  images confirms the above findings. IMPRESSION: Significant increase in the right-sided pleural effusion when compared with the prior exam. There is near complete collapse of the right lung with only mild aeration identified. No evidence of pulmonary emboli. Changes of esophageal stenting with both the metal stent as well as to plastic double pigtail stents. These traverse the previously seen esophageal defect. Persistent air-fluid is noted to the right of the esophagus but significantly improved in size when compared  with the prior exam. Small left pleural effusion. Stable left pulmonary nodules. The right pulmonary nodules are not well appreciated due to the near complete collapse of the right lung. Electronically Signed   By: Inez Catalina M.D.   On: 10/30/2015 16:56   Dg Chest Port 1 View  10/29/2015  CLINICAL DATA:  Sepsis EXAM: PORTABLE CHEST 1 VIEW COMPARISON:  09/30/2015 FINDINGS: Moderate to large right pleural effusion that is complex, non layering. There is underlying atelectasis and/or pneumonia. Linear opacity in the left mid lung is chronic and likely scar. Esophageal metallic stent and neighboring plastic stents which by abdominal CT extend into a right paraesophageal collection. No pneumothorax or edema. Nonspecific mediastinal widening, accentuated by rotation. Lung sutures on the right. IMPRESSION: Large complex right pleural effusion with underlying atelectasis or pneumonia, new from exam 1 month prior but similar findings reported on chest CT from [facility] 10/07/2015. 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 Electronically Signed   By: Monte Fantasia M.D.   On: 10/29/2015 00:08   Ct Renal Stone Study  10/28/2015  CLINICAL DATA:  66 year old female with acute or chronic back pain. Patient has known lung cancer. EXAM: CT ABDOMEN AND PELVIS WITHOUT CONTRAST TECHNIQUE: Multidetector CT imaging of the abdomen and pelvis was performed following the standard protocol without IV contrast. COMPARISON:  CT dated 10/09/2014 FINDINGS: Evaluation of this exam is limited in the absence of intravenous contrast. There are postsurgical changes of the visualized portions of the right lung with areas of consolidation. There is a 2.8 x 3.6 cm paramediastinal collection at the right lung base. A pigtail drainage catheter extends from this collection into the esophagus and terminates within the stomach. There is partially visualized loculated appearing right-sided pleural effusion. There  is a 5 mm nodule in the right lung base anteriorly. A 9 mm nodule in the left upper lobe/lingula has increased in size compared to the prior study (measured approximately 6 mm on the CT dated 10/09/2014). No intra-abdominal free air or free fluid. There is a 1.3 cm hypodense lesion in the left lobe of the liver similar to prior study of which is not well characterized on this study but may represent a cyst or hemangioma. The gallbladder, pancreas appear unremarkable. Small scattered calcified splenic granuloma. The adrenal glands appear unremarkable. Multiple small nonobstructing bilateral renal calculi measuring up to 3 mm in the upper pole of the right kidney. There is no hydronephrosis on either side. The visualized ureters and the urinary bladder appear unremarkable. There multiple fibroids within the uterus cell which are partially calcified. An esophageal stent is partially visualized. A there is no evidence of bowel obstruction or active inflammation. Normal appendix. The abdominal aorta and IVC are grossly unremarkable on this noncontrast study. No portal venous gas identified. There is no adenopathy. A 5 mm nodular density noted in the superficial subcutaneous soft tissues of the right anterior pelvic wall, nonspecific. There is a 1 cm nodular density in the subcutaneous soft tissues of the left anterior pelvic  wall (series 2, image 70). The abdominal wall soft tissues appear unremarkable. There is stable sclerotic focus in the S3 vertebrae as well as a stable focus of sclerosis in the superior aspect of the T8 vertebra. This findings are nonspecific. IMPRESSION: Small nonobstructing bilateral renal calculi.  No hydronephrosis. Postsurgical changes of the right lung base with an area of consolidative/infiltrative change and partially visualized right pleural effusion. Focal area of collection at the right lung base. There is a drainage catheter extending from this collection into the stomach. Bilateral  pulmonary nodules appear increased in size compared to prior study. Electronically Signed   By: Anner Crete M.D.   On: 10/28/2015 22:28     CBC  Recent Labs Lab 10/28/15 1931 10/29/15 0732 10/30/15 0147 10/31/15 0456  WBC 18.0* 13.2* 14.0* 12.1*  HGB 8.4* 7.6* 7.0* 9.5*  HCT 25.9* 24.4* 22.5* 29.1*  PLT 335 287 256 264  MCV 82.4 84.8 84.2 82.9  MCH 26.6 26.4 26.0 26.9  MCHC 32.3 31.2* 30.9* 32.5  RDW 18.6* 18.8* 19.1* 18.2*  LYMPHSABS 0.4*  --  0.6*  --   MONOABS 1.0*  --  1.4*  --   EOSABS 0.0  --  0.0  --   BASOSABS 0.0  --  0.0  --     Chemistries   Recent Labs Lab 10/28/15 1931 10/29/15 0732 10/29/15 1833 10/30/15 0147 10/31/15 0456 10/31/15 2047 11/01/15 0531  NA 135 138  --  138 135  --  134*  K 3.3* 2.9* 3.1* 4.5 3.1* 3.2* 3.8  CL 94* 103  --  106 100*  --  101  CO2 30 28  --  24 26  --  26  GLUCOSE 122* 98  --  85 98  --  77  BUN 17 12  --  11 8  --  6  CREATININE 0.55 0.39*  --  0.49 0.42*  --  0.42*  CALCIUM 8.4* 7.6*  --  7.8* 8.0*  --  8.0*  MG  --  1.5*  --  1.8 1.5*  --   --   AST 35  --   --   --   --   --   --   ALT 22  --   --   --   --   --   --   ALKPHOS 94  --   --   --   --   --   --   BILITOT 0.4  --   --   --   --   --   --    ------------------------------------------------------------------------------------------------------------------ estimated creatinine clearance is 67 mL/min (by C-G formula based on Cr of 0.42). ------------------------------------------------------------------------------------------------------------------ No results for input(s): HGBA1C in the last 72 hours. ------------------------------------------------------------------------------------------------------------------ No results for input(s): CHOL, HDL, LDLCALC, TRIG, CHOLHDL, LDLDIRECT in the last 72 hours. ------------------------------------------------------------------------------------------------------------------ No results for input(s): TSH,  T4TOTAL, T3FREE, THYROIDAB in the last 72 hours.  Invalid input(s): FREET3 ------------------------------------------------------------------------------------------------------------------ No results for input(s): VITAMINB12, FOLATE, FERRITIN, TIBC, IRON, RETICCTPCT in the last 72 hours.  Coagulation profile  Recent Labs Lab 10/31/15 0807  INR 1.29    No results for input(s): DDIMER in the last 72 hours.  Cardiac Enzymes No results for input(s): CKMB, TROPONINI, MYOGLOBIN in the last 168 hours.  Invalid input(s): CK ------------------------------------------------------------------------------------------------------------------ Invalid input(s): [place]  Patient is a 64 year old presenting with fever sepsis 1.  Sepsis ([place]) -  Urine cultures without any evidence of urine  infection Sepsis source likely pulmonary CT of the chest due to history of mediastinal esophageal fistula I suspect this to be the source Continue Vanco and Zosyn Case discussed with pulmonary day recommend transfer to [facility] I have discussed the case with the physician at [facility] and they have accepted the patient. Bed may not be available until tomorrow 2. A. 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 Patient arranged to go to [facility] 4.  Chronic back pain - continue pain medication 5.  Arthritis - when necessary Tylenol continue fentanyl 6. Hypokalemia we'll replace her potassium  7. Anemia severe iron deficiency however ferritin is high likelihood due to  inflammation hemoglobin stable today status post transfusion    Code Status Orders        Start     Ordered   10/29/15 0248  Full code   Continuous     10/29/15 0247    Code Status History    Date Active Date Inactive Code Status Order ID Comments User Context   This patient has a  current code status but no historical code status.           Consults  none  DVT Prophylaxis  scd's  Lab Results  Component Value Date   PLT 264 10/31/2015     Time Spent in minutes   64min I call [facility] arranged transfer for patient  Greater than 50% of time spent in care coordination and counseling patient regarding the condition and plan of care.   Dustin Flock M.D on 11/01/2015 at 3:12 PM  Between 7am to 6pm - Pager - [phone]  After 6pm go to [email or web address] - password [email or web address] [place] [facility]   Office  [phone]

## 2015-11-01 NOTE — Progress Notes (Signed)
Patient discharged by [facility]- per Dr. Bridgett Larsson. Gave report to [facility] RN Amy.  Vitals stable- All forms, CD included in transfer packet and given to EMS. Patient vitals stable.  Patient given 4 mg of morphine due to on-going back pain.  Daughter at bedside and given patient's new room number and nurse name.

## 2015-11-01 NOTE — Discharge Summary (Signed)
Theresa Mercado, 65 y.o., DOB 12-01-1950, MRN [id number]. 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 female who presents with Altered mental status to emergency room on June 4. Patient was also complaining of back pain. Patient was seen in the emergency room. And was noted to have abnormal UA. She had a CT per renal protocol. Which showed no hydronephrosis or pyelonephritis. Patient also was noted to have elevated lactic acid level. She was admitted for sepsis syndrome. And started on broad-spectrum antibiotics with vancomycin and Zosyn. Patient had a very elevated WBC count of 18,000. Patient urine culture showed no growth showed multiple contamination. She also had blood cultures which showed no growth. Due to her history of having mediastinal fistula she underwent a CT scan of the chest which showed worsening right-sided pleural effusion. Also nodules were increased in size. She has had a history of having external mediastinal drain as well as stenting in the esophagus by GI at [facility]. Patient has not shown much improvement at this point arrangements have been made to transfer her to [facility]. She also developed sinus tachycardia subsequently followed by atrial fibrillation with rapid ventricular rate. She was started on a Cardizem drip heart rate is improved she was seen by cardiology. She is transitioned to oral Cardizem for now. Patient's hemoglobin  also drifted down to 7 therefore she was transfused 1 unit of packed RBCs. Iron level is noted to be less than 5 but her ferritin is elevated likely related to inflammation. Hemoglobin is currently stable.  I discussed with [facility] hospital physician and Family physician Dr. Peyton Najjar. She accepted the patient to [facility] service. Hopefully discharge tonight.  Consults  cardiology, case discussed with pulmonary  Significant Tests:  See full reports for all details      Dg Chest 1 View  10/29/2015  CLINICAL DATA:  Sepsis, metastatic disease to the right lung EXAM: CHEST 1 VIEW COMPARISON:  Portable chest x-ray of October 28, 2015 FINDINGS: There remains volume loss on the right with large pleural effusion. The left lung is well-expanded and exhibits stable linear density in its midportion new since 2015 but not greatly changed since Sep 30, 2015. The left heart border is normal. The right heart border is obscured. The mediastinum is mildly widened though stable. The bony thorax exhibits no acute abnormality. IMPRESSION: Slight interval increase in the volume of pleural fluid on the right. Underlying atelectasis or consolidation of the right middle and lower lobes is suspected. No pulmonary edema. Stable atelectasis or scarring in the left mid lung. Electronically Signed   By: David  Martinique M.D.   On: 10/29/2015 07:28   Dg Thoracic Spine 2 View  10/28/2015  CLINICAL DATA:  Dorsalgia.  History of lung carcinoma EXAM: THORACIC SPINE 3 VIEWS COMPARISON:  August 13, 2014 thoracic spine radiographs and chest CT with bony reformats Sep 30, 2015 FINDINGS: Frontal, lateral, and swimmer's views were obtained. There is no fracture or spondylolisthesis. A sclerotic focus in the T8 vertebral body is better  appreciated by CT but is present on this study. There is mild disc space narrowing at multiple levels. No erosive change or paraspinous lesion. An esophageal stent is present. IMPRESSION: Sclerotic focus in the T8 vertebral body  concerning for a sclerotic metastasis is present, better seen on recent CT. No new bone lesion evident. Mild disc space narrowing at several levels. No fracture or spondylolisthesis. Electronically Signed   By: Lowella Grip III M.D.   On: 10/28/2015 18:39   Dg Lumbar Spine 2-3 Views  10/28/2015  CLINICAL DATA:  Lumbago.  History of lung carcinoma EXAM: LUMBAR SPINE - 2-3 VIEW COMPARISON:  Lumbar MRI March 25, 2014; CT abdomen pelvis with bony reformats Oct 09, 2014 FINDINGS: Frontal, lateral, and spot lumbosacral lateral images were obtained. There are 5 non-rib-bearing lumbar type vertebral bodies. There is no fracture or spondylolisthesis. There is mild disc space narrowing at L5-S1. Other disc spaces appear normal. No blastic or lytic bone lesions identified. No erosive change. There are multiple calcified leiomyomas noted in the pelvis. IMPRESSION: Scoliosis. Mild osteoarthritic change at L5-S1. No fracture or spondylolisthesis. No blastic or lytic lesions evident. Uterine leiomyomas, calcified, in the pelvis. Electronically Signed   By: Lowella Grip III M.D.   On: 10/28/2015 18:41   Ct Angio Chest Pe W/cm &/or Wo Cm  10/30/2015  CLINICAL DATA:  Persistent back and chest pain, history of prior esophageal stenting EXAM: CT ANGIOGRAPHY CHEST WITH CONTRAST TECHNIQUE: Multidetector CT imaging of the chest was performed using the standard protocol during bolus administration of intravenous contrast. Multiplanar CT image reconstructions and MIPs were obtained to evaluate the vascular anatomy. CONTRAST:  100 mL Isovue 370. COMPARISON:  CT from 09/30/2015 FINDINGS: The small left pleural effusion is noted. Mild left lower lobe atelectatic changes are seen. There are multiple stable nodules identified within the left lung similar to those noted on the prior examination. The right lung is nearly completely collapsed with only a small amount of each of the right lung lobes aerated. The pleural effusion on  the right has significantly enlarged now occupying almost the entire hemi thorax. The previously seen right-sided nodules are not well appreciated due to the significant consolidation within the right lung. There are changes consistent with esophageal stenting. Some fluid is noted within the esophagus as well as the stent. Multiple small double pigtail plastic stents are noted within the distal esophagus in the area of esophageal breakdown. A persistent air collection is noted to the right of the esophagus and extending into the mediastinum and likely pleural space on the right. This has improved in the interval from the prior exam with significant reduction in the air-fluid collection identified. The thoracic inlet shows no focal abnormality. The thoracic aorta and its branches are within normal limits. No extravasation is identified. The pulmonary artery demonstrates a normal branching pattern. No filling defects to suggest pulmonary emboli are identified. Cardiac structures are within normal limits. The visualized upper abdomen shows fatty infiltration of the liver. Bony structures again show sclerotic focus in the T8 vertebral body. No other bony abnormality is noted. Review of the MIP images confirms the above findings. IMPRESSION: Significant increase in the right-sided pleural effusion when compared with the prior exam. There is near complete collapse of the right lung with only mild aeration identified. No evidence of pulmonary emboli. Changes of esophageal stenting with both the metal stent as well as to plastic double pigtail stents. These traverse the previously seen esophageal defect. Persistent air-fluid is noted  to the right of the esophagus but significantly improved in size when compared with the prior exam. Small left pleural effusion. Stable left pulmonary nodules. The right pulmonary nodules are not well appreciated due to the near complete collapse of the right lung. Electronically Signed   By:  Inez Catalina M.D.   On: 10/30/2015 16:56   Dg Chest Port 1 View  10/29/2015  CLINICAL DATA:  Sepsis EXAM: PORTABLE CHEST 1 VIEW COMPARISON:  09/30/2015 FINDINGS: Moderate to large right pleural effusion that is complex, non layering. There is underlying atelectasis and/or pneumonia. Linear opacity in the left mid lung is chronic and likely scar. Esophageal metallic stent and neighboring plastic stents which by abdominal CT extend into a right paraesophageal collection. No pneumothorax or edema. Nonspecific mediastinal widening, accentuated by rotation. Lung sutures on the right. IMPRESSION: Large complex right pleural effusion with underlying atelectasis or pneumonia, new from exam 1 month prior but similar findings reported on chest CT from [facility] 10/07/2015. 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 Electronically Signed   By: Monte Fantasia M.D.   On: 10/29/2015 00:08   Ct Renal Stone Study  10/28/2015  CLINICAL DATA:  65 year old female with acute or chronic back pain. Patient has known lung cancer. EXAM: CT ABDOMEN AND PELVIS WITHOUT CONTRAST TECHNIQUE: Multidetector CT imaging of the abdomen and pelvis was performed following the standard protocol without IV contrast. COMPARISON:  CT dated 10/09/2014 FINDINGS: Evaluation of this exam is limited in the absence of intravenous contrast. There are postsurgical changes of the visualized portions of the right lung with areas of consolidation. There is a 2.8 x 3.6 cm paramediastinal collection at the right lung base. A pigtail drainage catheter extends from this collection into the esophagus and terminates within the stomach. There is partially visualized loculated appearing right-sided pleural effusion. There is a 5 mm nodule in the right lung base anteriorly. A 9 mm nodule in the left upper lobe/lingula has increased in size compared to the prior study (measured approximately 6 mm on the CT dated 10/09/2014). No  intra-abdominal free air or free fluid. There is a 1.3 cm hypodense lesion in the left lobe of the liver similar to prior study of which is not well characterized on this study but may represent a cyst or hemangioma. The gallbladder, pancreas appear unremarkable. Small scattered calcified splenic granuloma. The adrenal glands appear unremarkable. Multiple small nonobstructing bilateral renal calculi measuring up to 3 mm in the upper pole of the right kidney. There is no hydronephrosis on either side. The visualized ureters and the urinary bladder appear unremarkable. There multiple fibroids within the uterus cell which are partially calcified. An esophageal stent is partially visualized. A there is no evidence of bowel obstruction or active inflammation. Normal appendix. The abdominal aorta and IVC are grossly unremarkable on this noncontrast study. No portal venous gas identified. There is no adenopathy. A 5 mm nodular density noted in the superficial subcutaneous soft tissues of the right anterior pelvic wall, nonspecific. There is a 1 cm nodular density in the subcutaneous soft tissues of the left anterior pelvic wall (series 2, image 70). The abdominal wall soft tissues appear unremarkable. There is stable sclerotic focus in the S3 vertebrae as well as a stable focus of sclerosis in the superior aspect of the T8 vertebra. This findings are nonspecific. IMPRESSION: Small nonobstructing bilateral renal calculi.  No hydronephrosis. Postsurgical changes of the right lung base with an area of consolidative/infiltrative change and partially visualized  right pleural effusion. Focal area of collection at the right lung base. There is a drainage catheter extending from this collection into the stomach. Bilateral pulmonary nodules appear increased in size compared to prior study. Electronically Signed   By: Anner Crete M.D.   On: 10/28/2015 22:28       Today   Subjective:   Theresa Mercado  No complaint. On O2  [place] 2L.  Objective:   Blood pressure 120/74, pulse 85, temperature 100 F (37.8 C), temperature source Core (Comment), resp. rate 20, height 5\' 4"  (1.626 m), weight 148 lb 3.2 oz (67.223 kg), SpO2 89 %.  .  Intake/Output Summary (Last 24 hours) at 11/01/15 1450 Last data filed at 11/01/15 1400  Gross per 24 hour  Intake   1875 ml  Output    925 ml  Net    950 ml    Exam VITAL SIGNS: Blood pressure 120/74, pulse 85, temperature 100 F (37.8 C), temperature source Core (Comment), resp. rate 20, height 5\' 4"  (1.626 m), weight 148 lb 3.2 oz (67.223 kg), SpO2 89 %.  GENERAL:  65 y.o.-year-old patient lying in the bed with no acute distress.  EYES: Pupils equal, round, reactive to light and accommodation. No scleral icterus. Extraocular muscles intact.  HEENT: Head atraumatic, normocephalic. Oropharynx and nasopharynx clear.  NECK:  Supple, no jugular venous distention. No thyroid enlargement, no tenderness.  LUNGS:Diminished breath sound at the right lung base no wheezing or rhonchi  CARDIOVASCULAR:Irregularly irregular heart rhythm  normal. No murmurs, rubs, or gallops.  ABDOMEN: Soft, nontender, nondistended. Bowel sounds present. No organomegaly or mass.  EXTREMITIES: No pedal edema, cyanosis, or clubbing.  NEUROLOGIC: Cranial nerves II through XII are intact. Muscle strength 5/5 in all extremities. Sensation intact. Gait not checked.  PSYCHIATRIC: The patient is alert and oriented x 3.  SKIN: No obvious rash, lesion, or ulcer.   Data Review     CBC w Diff:  Lab Results  Component Value Date   WBC 12.1* 10/31/2015   WBC 6.4 04/10/2014   HGB 9.5* 10/31/2015   HGB 10.6* 04/10/2014   HCT 29.1* 10/31/2015   HCT 30.1* 04/10/2014   PLT 264 10/31/2015   PLT 160 04/10/2014   LYMPHOPCT 4 10/30/2015   LYMPHOPCT 15.0 04/10/2014   MONOPCT 10 10/30/2015   MONOPCT 11.4 04/10/2014   EOSPCT 0 10/30/2015   EOSPCT 0.0 04/10/2014   BASOPCT 0 10/30/2015   BASOPCT 0.2 04/10/2014   CMP:   Lab Results  Component Value Date   NA 134* 11/01/2015   NA 145 04/10/2014   K 3.8 11/01/2015   K 3.5 04/10/2014   CL 101 11/01/2015   CL 109* 04/10/2014   CO2 26 11/01/2015   CO2 28 04/10/2014   BUN 6 11/01/2015   BUN 6* 04/10/2014   CREATININE 0.42* 11/01/2015   CREATININE 0.76 04/10/2014   PROT 6.2* 10/28/2015   PROT 7.7 04/02/2014   ALBUMIN 1.9* 10/28/2015   ALBUMIN 3.9 04/02/2014   BILITOT 0.4 10/28/2015   BILITOT 0.5 04/02/2014   ALKPHOS 94 10/28/2015   ALKPHOS 80 04/02/2014   AST 35 10/28/2015   AST 19 04/02/2014   ALT 22 10/28/2015   ALT 32 04/02/2014  .  Micro Results Recent Results (from the past 240 hour(s))  Urine culture     Status: Abnormal   Collection Time: 10/28/15  9:15 PM  Result Value Ref Range Status   Specimen Description URINE, CLEAN CATCH  Final   Special Requests NONE  Final   Culture MULTIPLE SPECIES PRESENT, SUGGEST RECOLLECTION (A)  Final   Report Status 10/30/2015 FINAL  Final  Blood Culture (routine x 2)     Status: Abnormal   Collection Time: 10/29/15 12:00 AM  Result Value Ref Range Status   Specimen Description BLOOD LEFT ANTECUBITAL  Final   Special Requests BOTTLES DRAWN AEROBIC AND ANAEROBIC [place]  Final   Culture  Setup Time   Final    GRAM POSITIVE COCCI IN BOTH AEROBIC AND ANAEROBIC BOTTLES CRITICAL RESULT CALLED TO, READ BACK BY AND VERIFIED WITH: [place] ON 10/29/15 AT 2137 [facility] CONFIRMED BY DV    Culture (A)  Final    STREPTOCOCCUS ANGINOSIS ANAEROBIC BOTTLE ONLY STAPHYLOCOCCUS SPECIES (COAGULASE NEGATIVE) AEROBIC BOTTLE ONLY Results consistent with contamination. Performed at [facility]    Report Status 11/01/2015 FINAL  Final  Blood Culture (routine x 2)     Status: None (Preliminary result)   Collection Time: 10/29/15 12:00 AM  Result Value Ref Range Status   Specimen Description BLOOD LEFT ANTECUBITAL  Final   Special Requests BOTTLES DRAWN AEROBIC AND ANAEROBIC [place]  Final    Culture   Final    NO GROWTH 3 DAYS Performed at [facility]    Report Status PENDING  Incomplete  Blood Culture ID Panel (Reflexed)     Status: Abnormal   Collection Time: 10/29/15 12:00 AM  Result Value Ref Range Status   Enterococcus species NOT DETECTED NOT DETECTED Final   Vancomycin resistance NOT DETECTED NOT DETECTED Final   Listeria monocytogenes NOT DETECTED NOT DETECTED Final   Staphylococcus species NOT DETECTED NOT DETECTED Final   Staphylococcus aureus NOT DETECTED NOT DETECTED Final   Methicillin resistance NOT DETECTED NOT DETECTED Final   Streptococcus species DETECTED (A) NOT DETECTED Final    Comment: CRITICAL RESULT CALLED TO, READ BACK BY AND VERIFIED WITH: NATE COOKSON ON 10/29/15 AT 2137 [facility]    Streptococcus agalactiae NOT DETECTED NOT DETECTED Final   Streptococcus pneumoniae NOT DETECTED NOT DETECTED Final   Streptococcus pyogenes NOT DETECTED NOT DETECTED Final   Acinetobacter baumannii NOT DETECTED NOT DETECTED Final   Enterobacteriaceae species NOT DETECTED NOT DETECTED Final   Enterobacter cloacae complex NOT DETECTED NOT DETECTED Final   Escherichia coli NOT DETECTED NOT DETECTED Final   Klebsiella oxytoca NOT DETECTED NOT DETECTED Final   Klebsiella pneumoniae NOT DETECTED NOT DETECTED Final   Proteus species NOT DETECTED NOT DETECTED Final   Serratia marcescens NOT DETECTED NOT DETECTED Final   Carbapenem resistance NOT DETECTED NOT DETECTED Final   Haemophilus influenzae NOT DETECTED NOT DETECTED Final   Neisseria meningitidis NOT DETECTED NOT DETECTED Final   Pseudomonas aeruginosa NOT DETECTED NOT DETECTED Final   Candida albicans NOT DETECTED NOT DETECTED Final   Candida glabrata NOT DETECTED NOT DETECTED Final   Candida krusei NOT DETECTED NOT DETECTED Final   Candida parapsilosis NOT DETECTED NOT DETECTED Final   Candida tropicalis NOT DETECTED NOT DETECTED Final  MRSA PCR Screening     Status: None   Collection Time: 10/29/15  2:46  AM  Result Value Ref Range Status   MRSA by PCR NEGATIVE NEGATIVE Final    Comment:        The GeneXpert MRSA Assay (FDA approved for NASAL specimens only), is one component of a comprehensive MRSA colonization surveillance program. It is not intended to diagnose MRSA infection nor to guide or monitor treatment for MRSA infections.  Code Status Orders        Start     Ordered   10/29/15 0248  Full code   Continuous     10/29/15 0247    Code Status History    Date Active Date Inactive Code Status Order ID Comments User Context   This patient has a current code status but no historical code status.             Total Time in preparing paper work, data evaluation and todays exam - 42 minutes  Demetrios Loll M.D on 11/01/2015 at 2:50 PM  [facility]   Office  [phone]

## 2015-11-01 NOTE — Progress Notes (Signed)
Palliative Care Update  Pt has had only one long acting med for pain and that is the lowest dose of fentanyl patch.  She had Dilaudid 1 mg prn, Morphine 2 mg prn, and Oxycodone 5 mg prn. All of these latter three are very short acting.   I was asked to help with pts pain meds (befores signing off). I have ordered MS Contin 15 mg bid.  This won't be strong enough and should be titrated up to 30 mg bid to tid tomorrow. I have increased her Morphine IV for breakthrough to 4 mg q 4 hrs prn (from 2 mg).  I have DCd the Dilaudid IV prn. We need to narrow the choices of meds but add a long acting version of morphine  -and increase the short acting morphine.   Colleen Can, MD

## 2015-11-01 NOTE — Progress Notes (Signed)
SUBJECTIVE: The patient denies any chest pain or shortness of breath and is being considered for transfer to [facility].   Filed Vitals:   11/01/15 0500 11/01/15 0600 11/01/15 0700 11/01/15 0800  BP: 103/59 110/61 100/65 111/63  Pulse: 80 80 73 84  Temp: 99.9 F (37.7 C) 100 F (37.8 C) 100 F (37.8 C) 99.9 F (37.7 C)  TempSrc:      Resp: 12 14 10 11   Height:      Weight:      SpO2: 98% 97% 100% 98%    Intake/Output Summary (Last 24 hours) at 11/01/15 0847 Last data filed at 11/01/15 0700  Gross per 24 hour  Intake   2248 ml  Output   1575 ml  Net    673 ml    LABS: Basic Metabolic Panel:  Recent Labs  10/30/15 0147 10/31/15 0456 10/31/15 2047 11/01/15 0531  NA 138 135  --  134*  K 4.5 3.1* 3.2* 3.8  CL 106 100*  --  101  CO2 24 26  --  26  GLUCOSE 85 98  --  77  BUN 11 8  --  6  CREATININE 0.49 0.42*  --  0.42*  CALCIUM 7.8* 8.0*  --  8.0*  MG 1.8 1.5*  --   --   PHOS 2.1* 2.5  --   --    Liver Function Tests: No results for input(s): AST, ALT, ALKPHOS, BILITOT, PROT, ALBUMIN in the last 72 hours. No results for input(s): LIPASE, AMYLASE in the last 72 hours. CBC:  Recent Labs  10/30/15 0147 10/31/15 0456  WBC 14.0* 12.1*  NEUTROABS 12.0*  --   HGB 7.0* 9.5*  HCT 22.5* 29.1*  MCV 84.2 82.9  PLT 256 264   Cardiac Enzymes: No results for input(s): CKTOTAL, CKMB, CKMBINDEX, TROPONINI in the last 72 hours. BNP: Invalid input(s): POCBNP D-Dimer: No results for input(s): DDIMER in the last 72 hours. Hemoglobin A1C: No results for input(s): HGBA1C in the last 72 hours. Fasting Lipid Panel: No results for input(s): CHOL, HDL, LDLCALC, TRIG, CHOLHDL, LDLDIRECT in the last 72 hours. Thyroid Function Tests: No results for input(s): TSH, T4TOTAL, T3FREE, THYROIDAB in the last 72 hours.  Invalid input(s): FREET3 Anemia Panel: No results for input(s): VITAMINB12, FOLATE, FERRITIN, TIBC, IRON, RETICCTPCT in the last 72 hours.   PHYSICAL  EXAM General: Well developed, well nourished, in no acute distress HEENT:  Normocephalic and atramatic Neck:  No JVD.  Lungs: Clear bilaterally to auscultation and percussion. Heart: HRRR . Normal S1 and S2 without gallops or murmurs.  Abdomen: Bowel sounds are positive, abdomen soft and non-tender  Msk:  Back normal, normal gait. Normal strength and tone for age. Extremities: No clubbing, cyanosis or edema.   Neuro: Alert and oriented X 3. Psych:  Good affect, responds appropriately  TELEMETRY:Currently sinus rhythm 80 bpm but has intermittent A. fib on the monitor.  ASSESSMENT AND PLAN: Sinus tachycardia/sepsis , patient probably developed intermittent A. fib and sinus tachycardia due to underlying sepsis. Patient was started on beta blockers advise continuing that with follow-up at [facility].  Principal Problem:   Sepsis ([place]) Active Problems:   UTI (lower urinary tract infection)   Cancer of parotid gland (HCC)   Arthritis   Chronic back pain    Ryver Zadrozny A, MD, [facility] 11/01/2015 8:47 AM

## 2015-11-01 NOTE — Progress Notes (Signed)
Notified Dr. Megan Salon that patient's pain is not under control.  She stated she would review patient's MAR and adjust medications as needed.

## 2015-11-01 NOTE — Progress Notes (Signed)
Per Dr. Bridgett Larsson- transfer patient to 2A - but keep patient here in ICU while waiting on a bed to [facility].

## 2015-11-06 LAB — CULTURE, BLOOD (ROUTINE X 2)

## 2015-11-07 LAB — CULTURE, BLOOD (ROUTINE X 2)

## 2015-12-25 DEATH — deceased

## 2016-11-03 IMAGING — CT CT RENAL STONE PROTOCOL
3 of 4 series · 8 of 46 positions shown, 13 images · non-contrast
Comparison: CT dated 10/09/2014

CLINICAL DATA: 64-year-old female with acute or chronic back pain.
Patient has known lung cancer.

EXAM:
CT ABDOMEN AND PELVIS WITHOUT CONTRAST
TECHNIQUE: Multidetector CT imaging of the abdomen and pelvis was performed
following the standard protocol without IV contrast.

[Series 4: lung · axial · 0.71mm/px · z∈[-131,-71]mm · 4 of 22 slices shown, 9 images]
[im 5/22  soft-tissue]
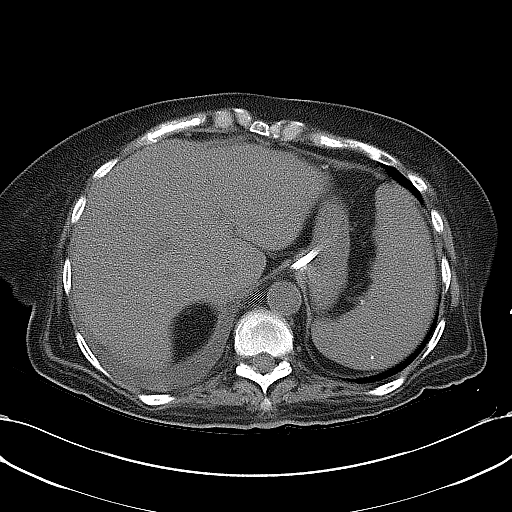
[im 5/22  lung]
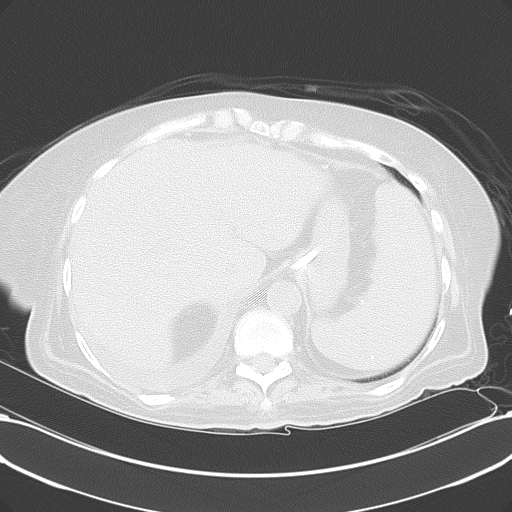
[im 5/22  bone]
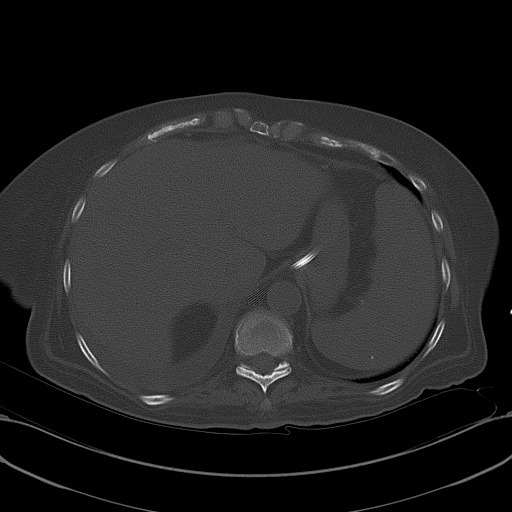
[im 9/22  soft-tissue]
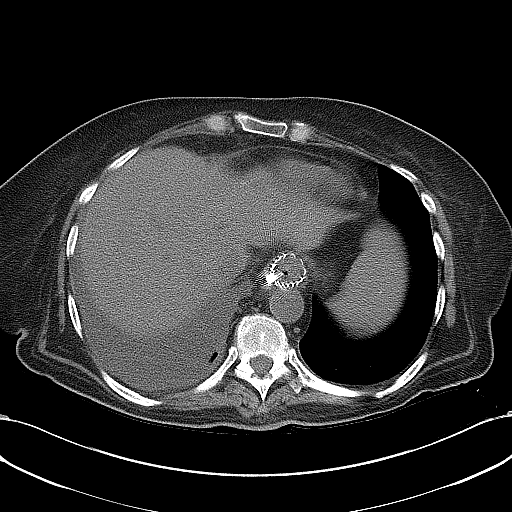
[im 9/22  lung]
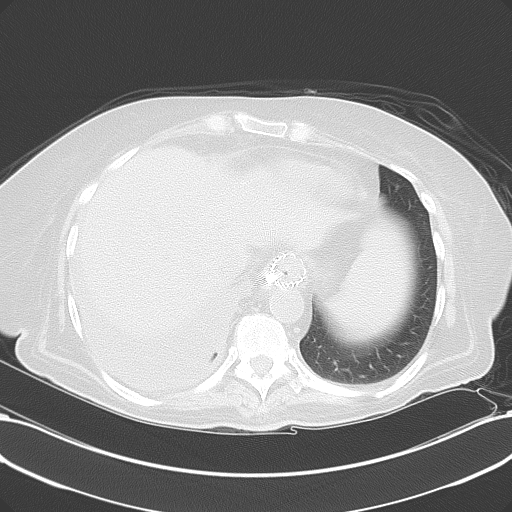
[im 13/22  soft-tissue]
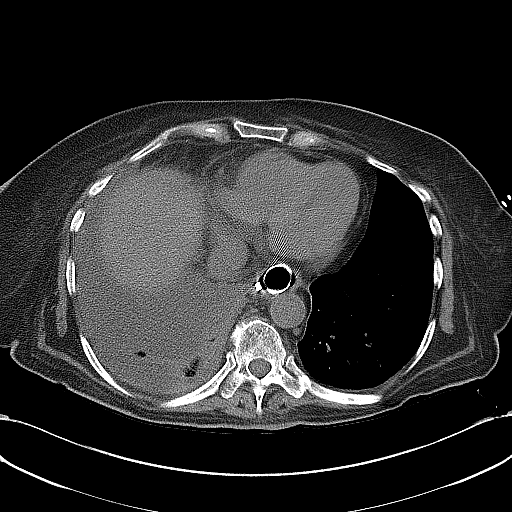
[im 13/22  lung]
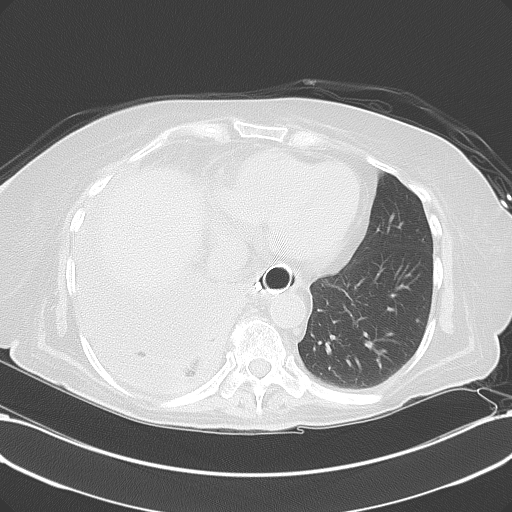
[im 17/22  soft-tissue]
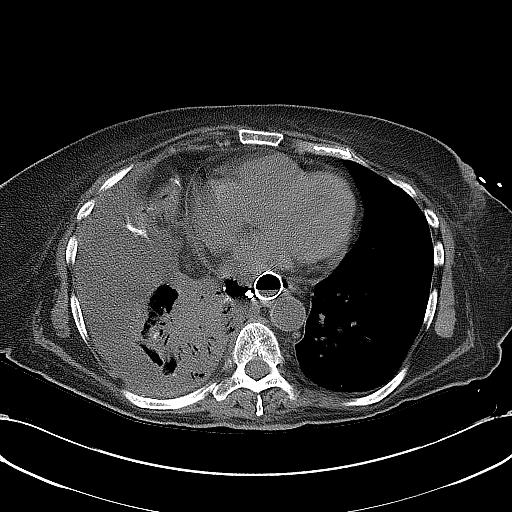
[im 17/22  lung]
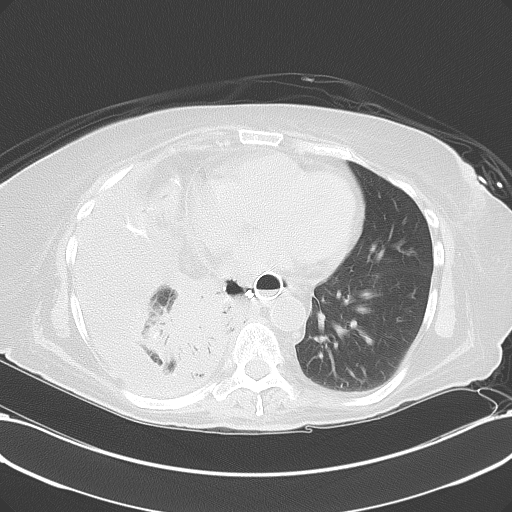

[Series 5: coronal · coronal · 0.69mm/px · 3 of 115 slices shown]
[im 39/115  soft-tissue]
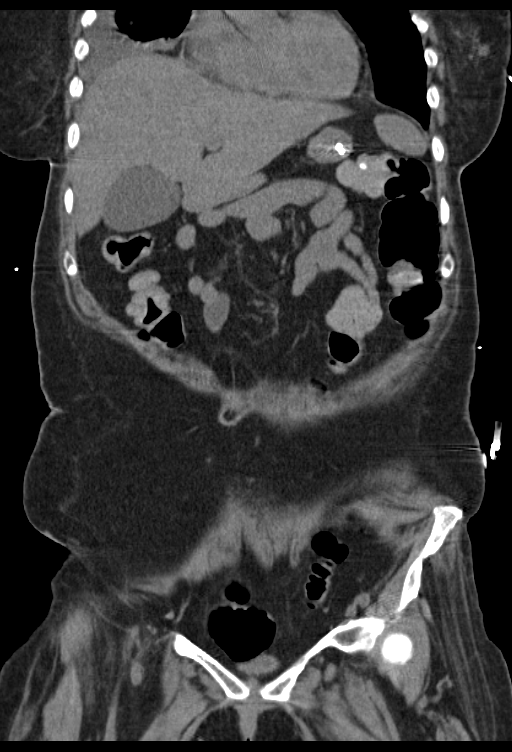
[im 51/115  soft-tissue]
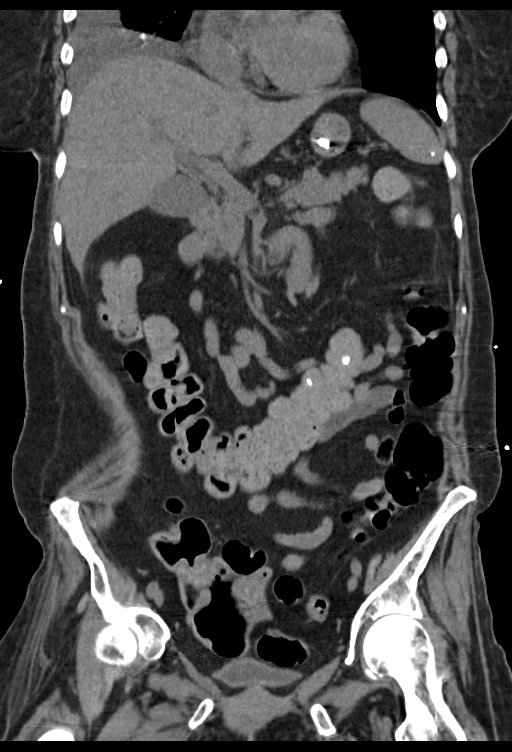
[im 64/115  soft-tissue]
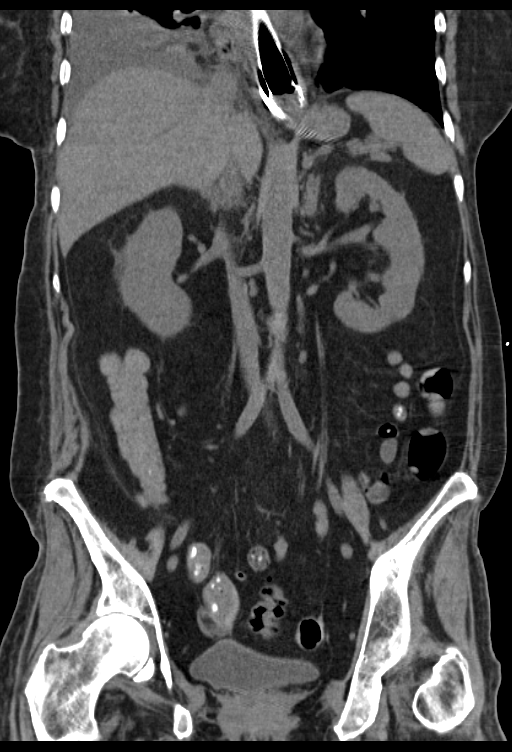

[Series 6: sagittal · sagittal · 0.41mm/px · 1 of 162 slices shown]
[im 54/162  soft-tissue]
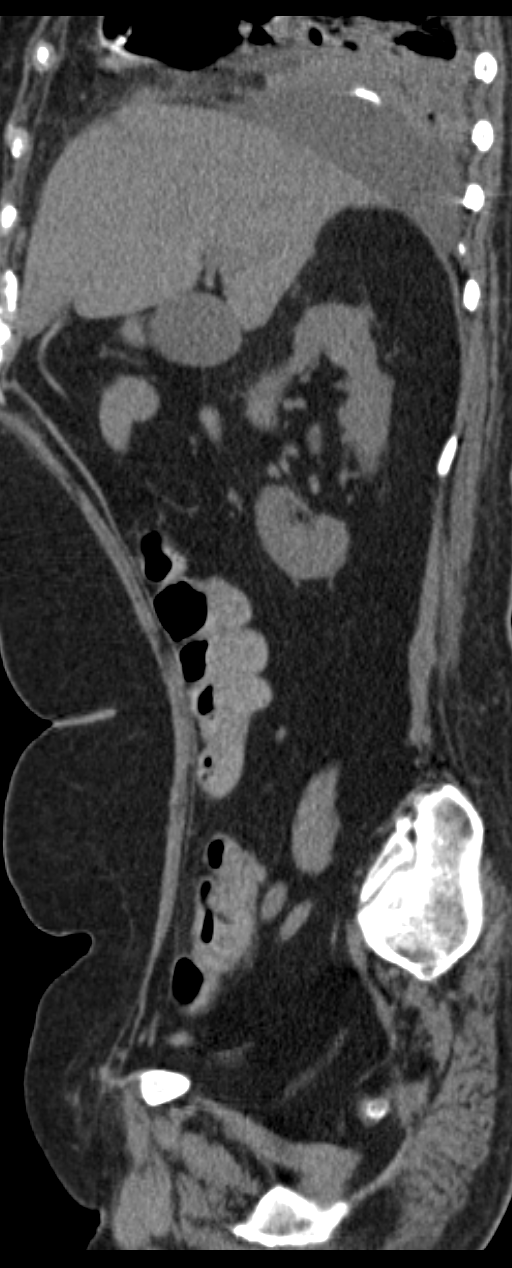

[8 of 46 positions shown; findings below may reference images not displayed]

FINDINGS: Evaluation of this exam is limited in the absence of intravenous
contrast.

There are postsurgical changes of the visualized portions of the
right lung with areas of consolidation. There is a 2.8 x 3.6 cm
paramediastinal collection at the right lung base. A pigtail
drainage catheter extends from this collection into the esophagus
and terminates within the stomach. There is partially visualized
loculated appearing right-sided pleural effusion. There is a 5 mm
nodule in the right lung base anteriorly. A 9 mm nodule in the left
upper lobe/lingula has increased in size compared to the prior study
(measured approximately 6 mm on the CT dated 10/09/2014).

No intra-abdominal free air or free fluid.

There is a 1.3 cm hypodense lesion in the left lobe of the liver
similar to prior study of which is not well characterized on this
study but may represent a cyst or hemangioma. The gallbladder,
pancreas appear unremarkable. Small scattered calcified splenic
granuloma. The adrenal glands appear unremarkable.

Multiple small nonobstructing bilateral renal calculi measuring up
to 3 mm in the upper pole of the right kidney. There is no
hydronephrosis on either side. The visualized ureters and the
urinary bladder appear unremarkable. There multiple fibroids within
the uterus cell which are partially calcified.

An esophageal stent is partially visualized. A there is no evidence
of bowel obstruction or active inflammation. Normal appendix.

The abdominal aorta and IVC are grossly unremarkable on this
noncontrast study. No portal venous gas identified. There is no
adenopathy. A 5 mm nodular density noted in the superficial
subcutaneous soft tissues of the right anterior pelvic wall,
nonspecific. There is a 1 cm nodular density in the subcutaneous
soft tissues of the left anterior pelvic wall (series 2, image 70).
The abdominal wall soft tissues appear unremarkable. There is stable
sclerotic focus in the S3 vertebrae as well as a stable focus of
sclerosis in the superior aspect of the T8 vertebra. This findings
are nonspecific.
IMPRESSION: Small nonobstructing bilateral renal calculi.  No hydronephrosis.

Postsurgical changes of the right lung base with an area of
consolidative/infiltrative change and partially visualized right
pleural effusion. Focal area of collection at the right lung base.
There is a drainage catheter extending from this collection into the
stomach.

Bilateral pulmonary nodules appear increased in size compared to
prior study.

## 2016-11-03 IMAGING — CR DG THORACIC SPINE 2V
3 series · 3 of 3 positions shown · non-contrast
Comparison: August 13, 2014 thoracic spine radiographs and chest CT
with bony reformats September 30, 2015

CLINICAL DATA: Dorsalgia.  History of lung carcinoma

EXAM:
THORACIC SPINE 3 VIEWS

[t-spine ap]
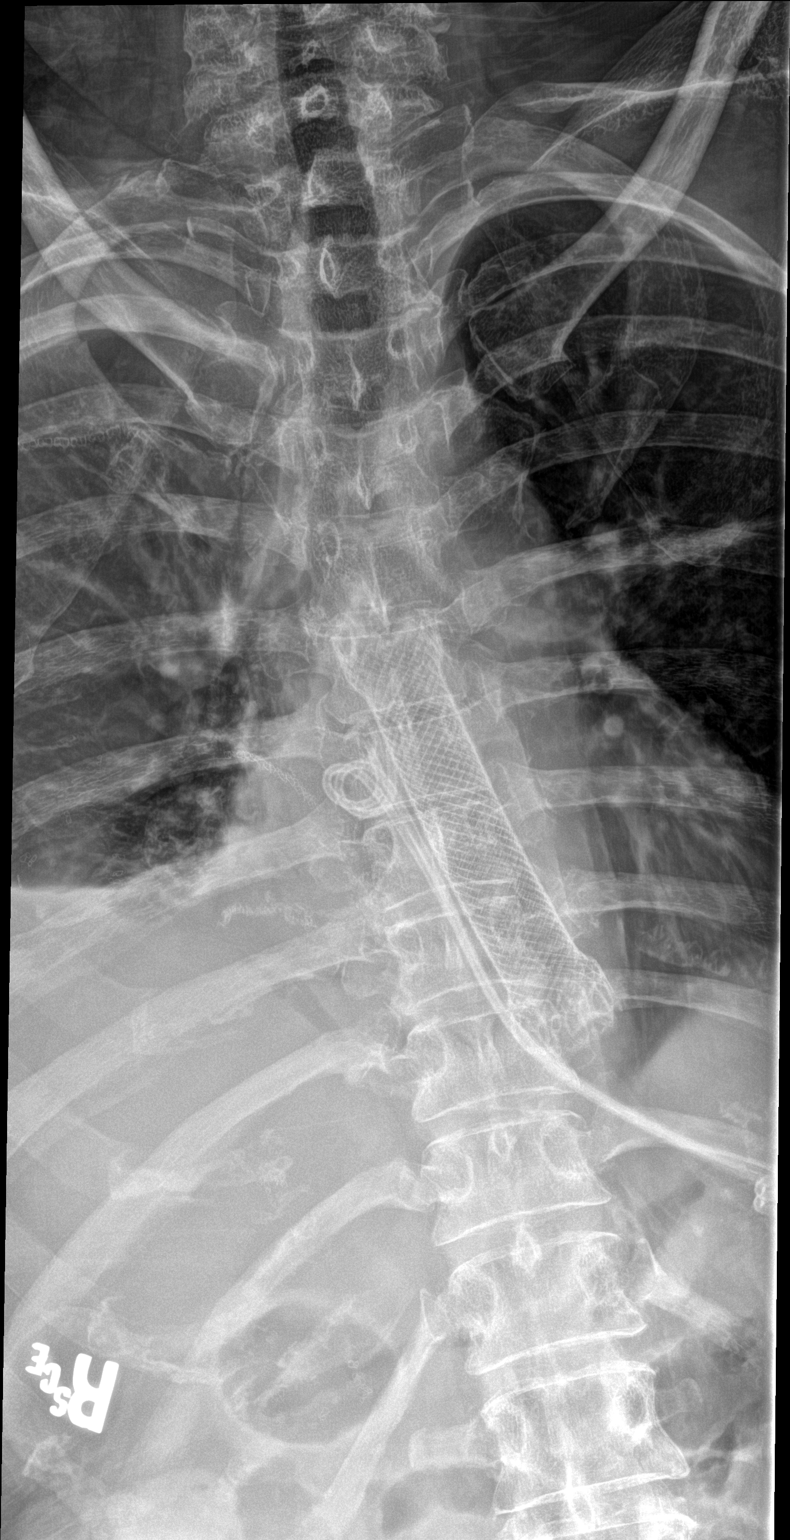

[t-spine lat]
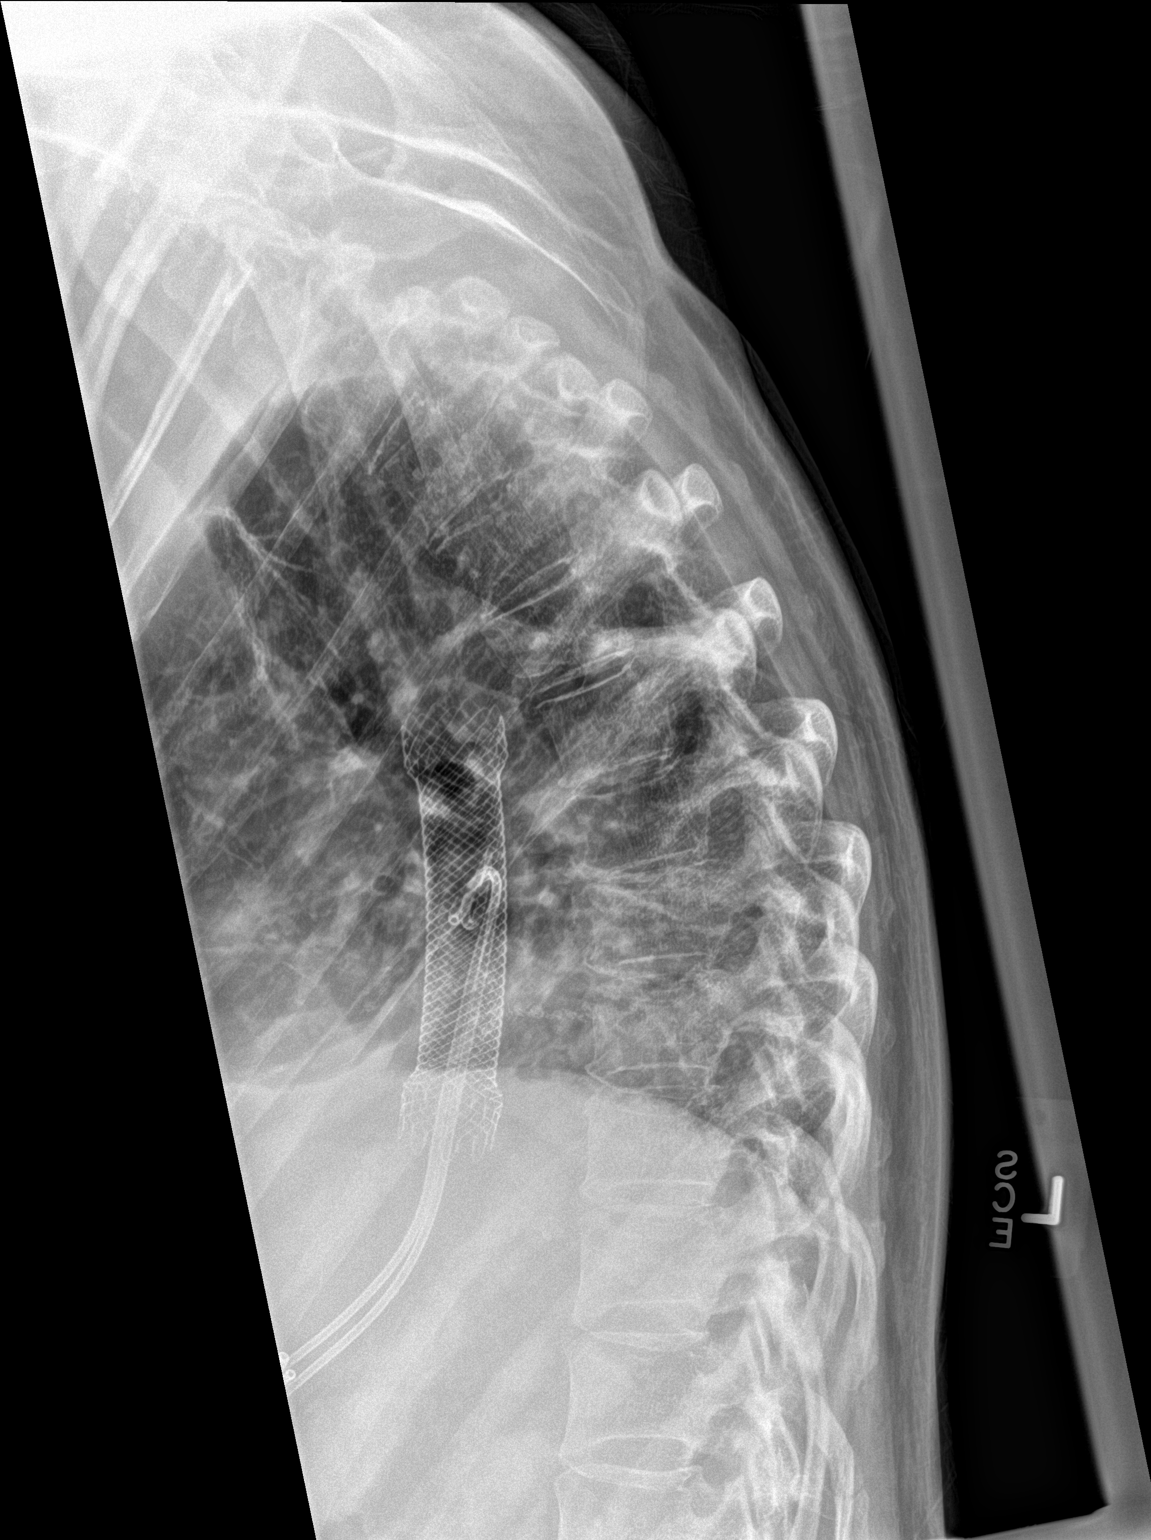

[t-spine swimmers]
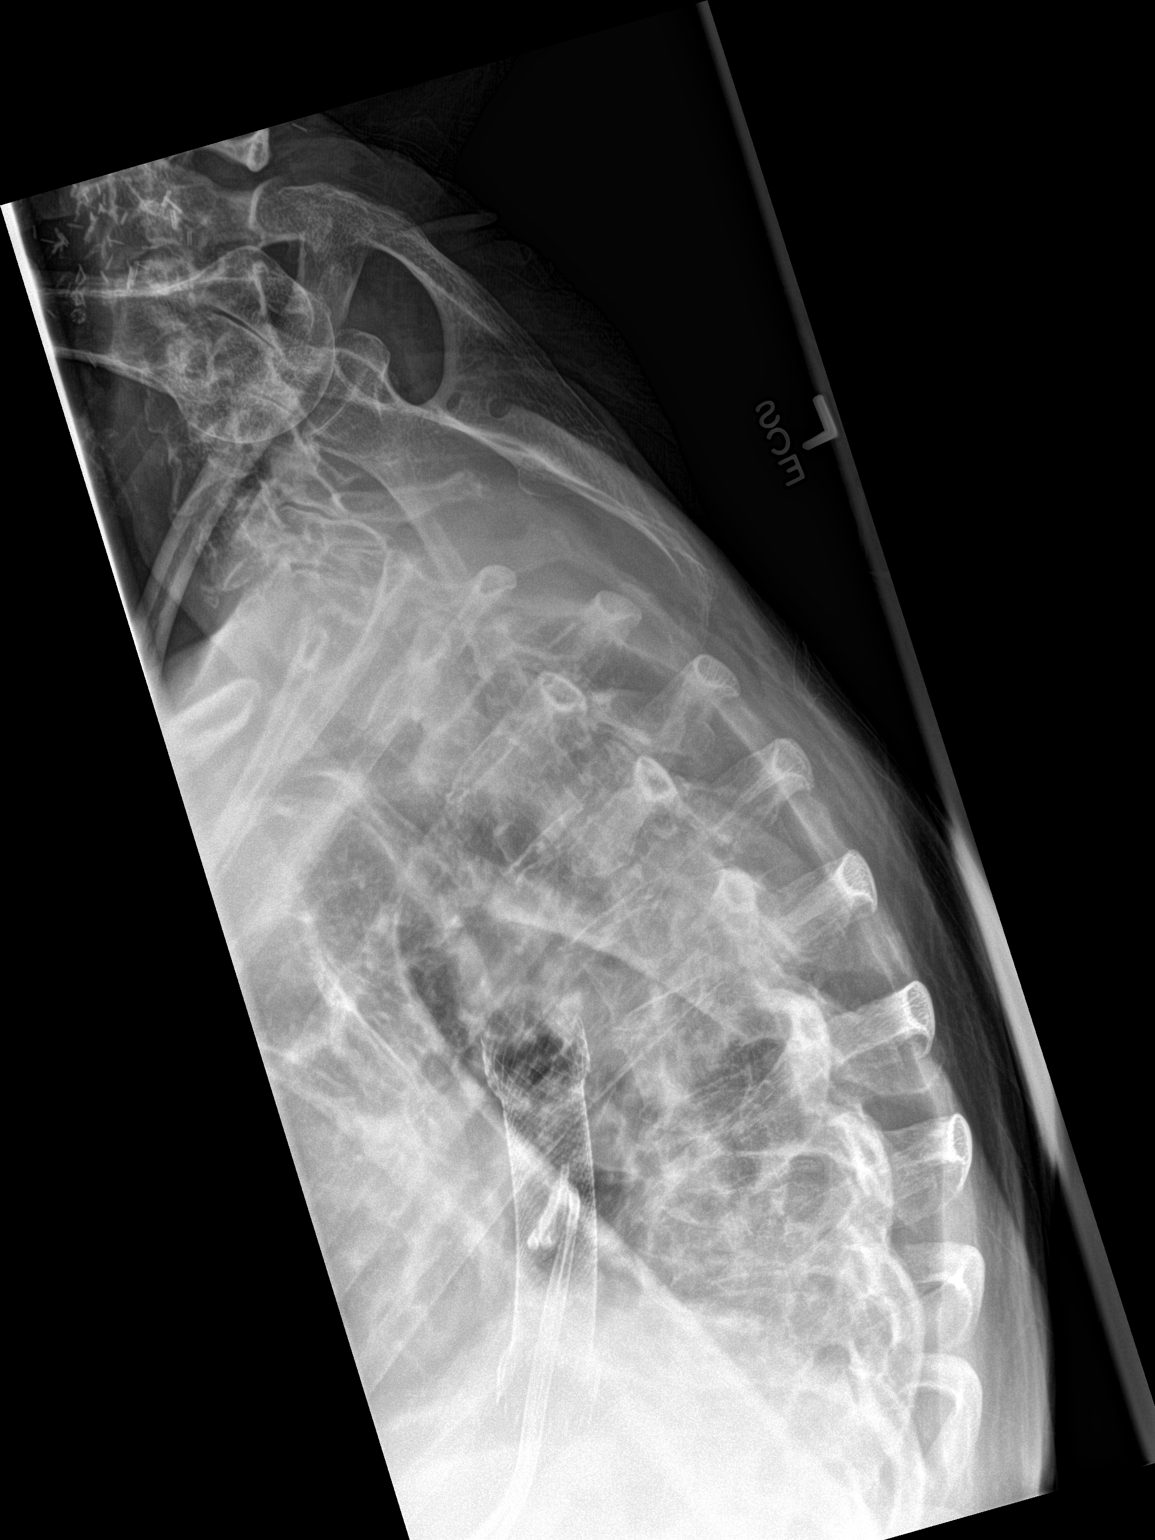

[3 of 3 positions shown; findings below may reference images not displayed]

FINDINGS: Frontal, lateral, and swimmer's views were obtained. There is no
fracture or spondylolisthesis. A sclerotic focus in the T8 vertebral
body is better appreciated by CT but is present on this study. There
is mild disc space narrowing at multiple levels. No erosive change
or paraspinous lesion. An esophageal stent is present.
IMPRESSION: Sclerotic focus in the T8 vertebral body concerning for a sclerotic
metastasis is present, better seen on recent CT. No new bone lesion
evident. Mild disc space narrowing at several levels. No fracture or
spondylolisthesis.

## 2016-11-04 IMAGING — DX DG CHEST 1V
1 series · 1 of 1 positions shown · non-contrast
Comparison: Portable chest x-ray October 28, 2015

CLINICAL DATA: Sepsis, metastatic disease to the right lung

EXAM:
CHEST 1 VIEW

[chest ap]
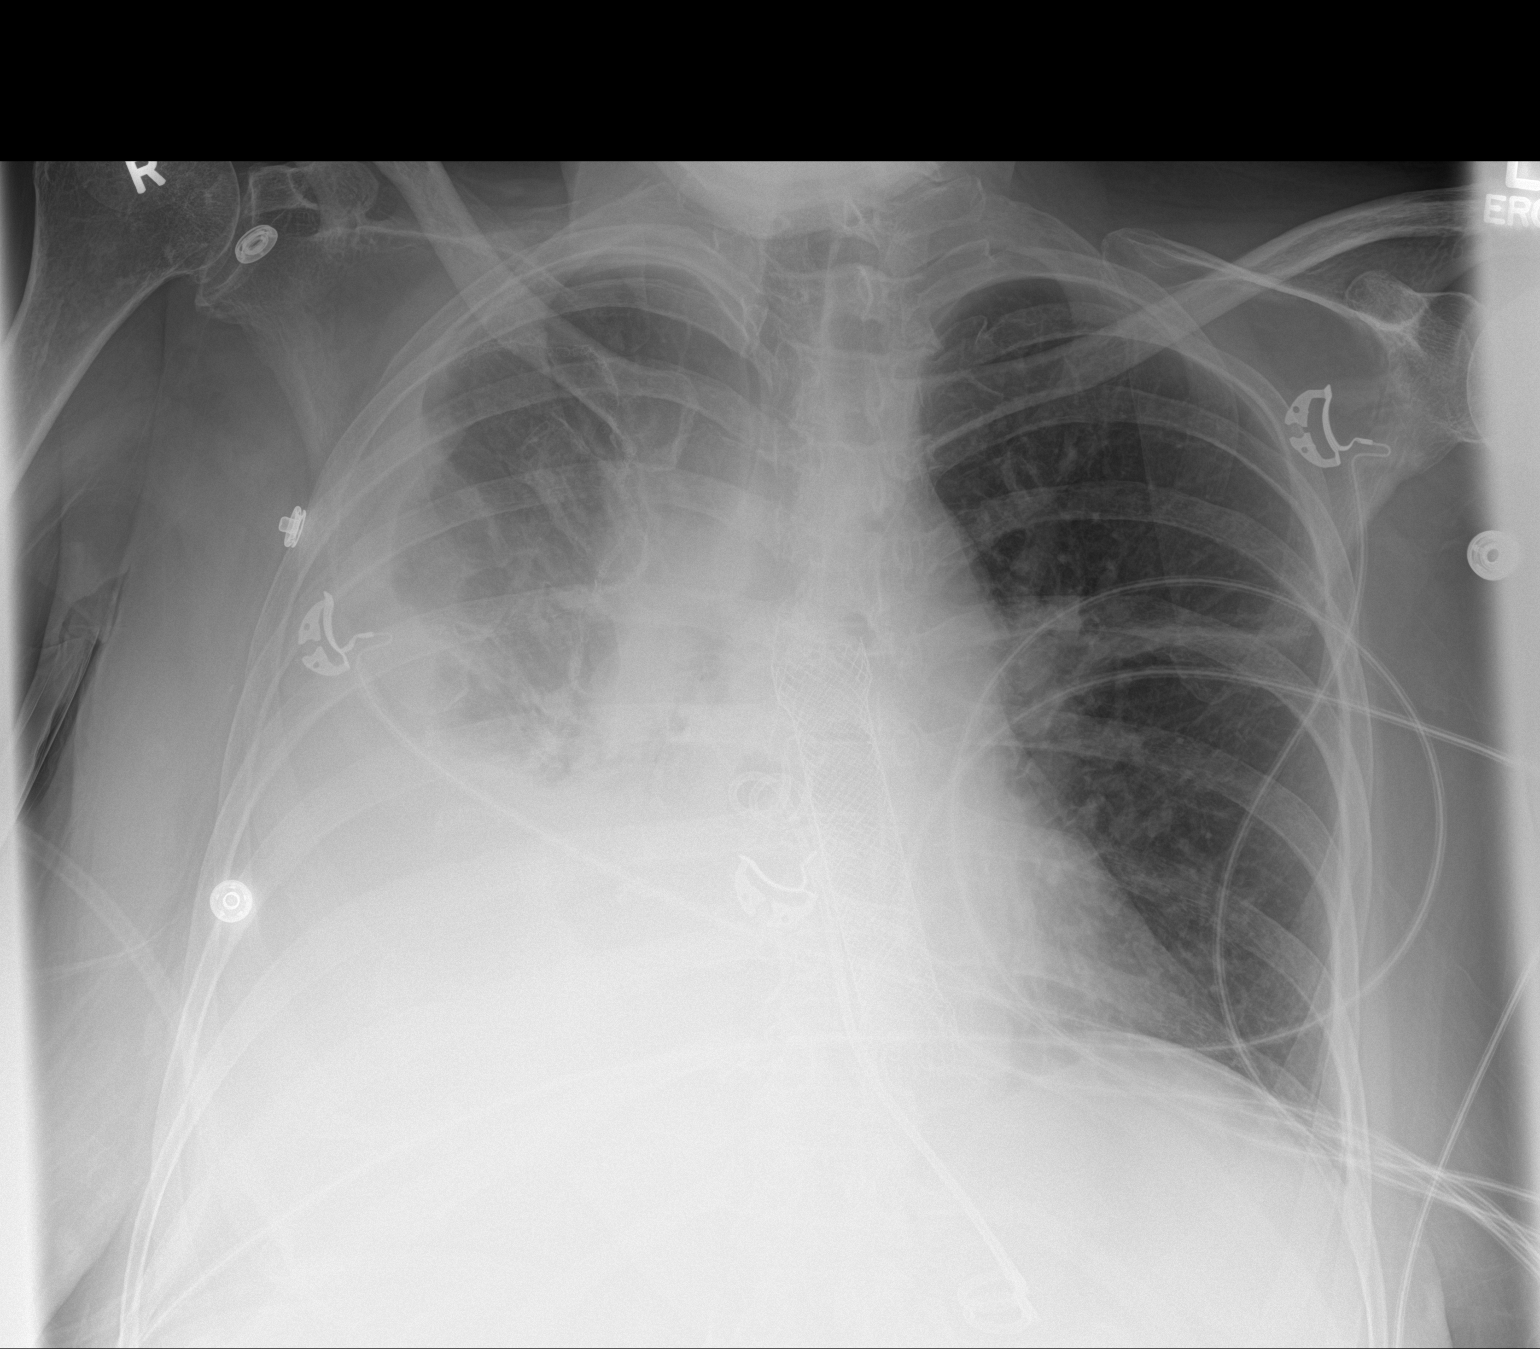

[1 of 1 positions shown; findings below may reference images not displayed]

FINDINGS: There remains volume loss on the right with large pleural effusion.
The left lung is well-expanded and exhibits stable linear density in
its midportion new since 7029 but not greatly changed since September 30, 2015. The left heart border is normal. The right heart border is
obscured. The mediastinum is mildly widened though stable. The bony
thorax exhibits no acute abnormality.
IMPRESSION: Slight interval increase in the volume of pleural fluid on the
right. Underlying atelectasis or consolidation of the right middle
and lower lobes is suspected. No pulmonary edema. Stable atelectasis
or scarring in the left mid lung.
